# Patient Record
Sex: Female | Born: 1985 | Race: White | Hispanic: No | Marital: Single | Smoking: Never smoker
Health system: Southern US, Community
[De-identification: ages and names within clinical notes are randomized; demographics above are authoritative.]

## PROBLEM LIST (undated history)

## (undated) DIAGNOSIS — B9689 Other specified bacterial agents as the cause of diseases classified elsewhere: Secondary | ICD-10-CM

## (undated) DIAGNOSIS — Z9109 Other allergy status, other than to drugs and biological substances: Secondary | ICD-10-CM

## (undated) DIAGNOSIS — N760 Acute vaginitis: Secondary | ICD-10-CM

## (undated) DIAGNOSIS — B009 Herpesviral infection, unspecified: Secondary | ICD-10-CM

## (undated) DIAGNOSIS — B379 Candidiasis, unspecified: Secondary | ICD-10-CM

## (undated) HISTORY — DX: Herpesviral infection, unspecified: B00.9

## (undated) HISTORY — DX: Other specified bacterial agents as the cause of diseases classified elsewhere: B96.89

## (undated) HISTORY — PX: WISDOM TOOTH EXTRACTION: SHX21

## (undated) HISTORY — DX: Candidiasis, unspecified: B37.9

## (undated) HISTORY — PX: TONSILLECTOMY: SUR1361

## (undated) HISTORY — DX: Other allergy status, other than to drugs and biological substances: Z91.09

## (undated) HISTORY — DX: Other specified bacterial agents as the cause of diseases classified elsewhere: N76.0

---

## 2011-01-14 ENCOUNTER — Emergency Department (HOSPITAL_COMMUNITY)
Admission: EM | Admit: 2011-01-14 | Discharge: 2011-01-14 | Payer: Self-pay | Source: Home / Self Care | Admitting: Emergency Medicine

## 2013-10-29 ENCOUNTER — Ambulatory Visit (INDEPENDENT_AMBULATORY_CARE_PROVIDER_SITE_OTHER): Payer: BC Managed Care – PPO | Admitting: Family Medicine

## 2013-10-29 VITALS — BP 114/66 | HR 93 | Temp 98.6°F | Resp 17 | Ht 66.5 in | Wt 143.6 lb

## 2013-10-29 DIAGNOSIS — J309 Allergic rhinitis, unspecified: Secondary | ICD-10-CM

## 2013-10-29 DIAGNOSIS — R0981 Nasal congestion: Secondary | ICD-10-CM

## 2013-10-29 MED ORDER — PSEUDOEPHEDRINE HCL ER 120 MG PO TB12: 120.0000 mg | ORAL_TABLET | Freq: Two times a day (BID) | ORAL | Status: DC

## 2013-10-29 MED ORDER — IPRATROPIUM BROMIDE 0.03 % NA SOLN: 2.0000 | Freq: Two times a day (BID) | NASAL | Status: DC

## 2013-10-29 MED ORDER — METHYLPREDNISOLONE ACETATE 80 MG/ML IJ SUSP
80.0000 mg | Freq: Once | INTRAMUSCULAR | Status: AC
  Administered 2013-10-29: 80 mg via INTRAMUSCULAR

## 2013-10-29 MED ORDER — HYDROCOD POLST-CHLORPHEN POLST 10-8 MG/5ML PO LQCR: 5.0000 mL | Freq: Two times a day (BID) | ORAL | Status: DC | PRN

## 2013-10-29 MED ORDER — FLUTICASONE PROPIONATE 50 MCG/ACT NA SUSP: 2.0000 | Freq: Every day | NASAL | Status: DC

## 2013-10-29 NOTE — Progress Notes (Addendum)
This chart was scribed for Norberto Sorenson, MD by Ardelia Mems, Scribe. This patient was seen in room Room 13 and the patient's care was started at 10:33 AM.  Subjective:    Patient ID: Barbara Finley, female    DOB: 02-20-1986, 27 y.o.   MRN: [id number]  Chief Complaint  Patient presents with  . 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 She reports associated sinus pressure, postnasal drip and a cough, productive of lime green mucus with this as well. She states that she has been blowing her nose frequently, and has been seeing lime green mucous with this. She states that she had a "scratchy" sore throat at the onset of symptoms 3 days ago which she states subsided with the help of Mucinex. She states that she has been having some difficulty sleeping at night in association with symptoms. She states that she has been eating and drinking normally. She also states that she has been urinating and producing stools normally. She states that she has similar symptoms in the past multiple times, and that she has been having similar symptoms about every 3 months recently. She denies ear pain, headaches, jaw pain, SOB or chest pain.  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 Wonders if there is anything she can do to prevent this.  History reviewed. No pertinent past medical history.  No current outpatient prescriptions on file prior to visit.   No current facility-administered medications on file prior to visit.   No Known Allergies  Review of Systems  Constitutional: Negative for appetite change.  HENT: Positive for congestion, postnasal drip, sinus pressure and sore throat (subsided). Negative for dental problem and ear pain.   Respiratory: Positive for cough. Negative  for shortness of breath.   Cardiovascular: Negative for chest pain.  Gastrointestinal: Negative for diarrhea and constipation.  Genitourinary: Negative for dysuria, urgency and hematuria.  Neurological: Negative for headaches.  Psychiatric/Behavioral: Positive for sleep disturbance.      BP 114/66  Pulse 93  Temp(Src) 98.6 F (37 C) (Oral)  Resp 17  Ht 5' 6.5" (1.689 m)  Wt 143 lb 9.6 oz (65.137 kg)  BMI 22.83 kg/m2  SpO2 98% Objective:   Physical Exam  Nursing note and vitals reviewed. Constitutional: She is oriented to person, place, and time. She appears well-developed and well-nourished. No distress.  HENT:  Head: Normocephalic and atraumatic.  Right Ear: Tympanic membrane is not injected, not erythematous and not retracted. A middle ear effusion (large) is present.  Left Ear: Tympanic membrane is not injected, not erythematous and not retracted. A middle ear effusion (large) is present.  Nose: Mucosal edema (left worse than right) and rhinorrhea (right worse than left) present.  Mouth/Throat: Posterior oropharyngeal erythema present. No oropharyngeal exudate.  Tonsils surgically removed.  Eyes: EOM are normal.  Neck: Neck supple. No tracheal deviation present. No mass and no thyromegaly present.  Cardiovascular: Normal rate, regular rhythm and normal heart sounds.   No murmur heard. Pulmonary/Chest: Effort normal and breath sounds normal. No respiratory distress. She has no wheezes. She has no rales.  Musculoskeletal: Normal range of motion.  Lymphadenopathy:       Head (right side): No submandibular, no tonsillar, no preauricular and no posterior auricular adenopathy present.  Head (left side): No submandibular, no tonsillar, no preauricular and no posterior auricular adenopathy present.    She has no cervical adenopathy.       Right cervical: No superficial cervical and no posterior cervical adenopathy present.      Left cervical: No superficial cervical and no  posterior cervical adenopathy present.       Right: No supraclavicular adenopathy present.       Left: No supraclavicular adenopathy present.  Neurological: She is alert and oriented to person, place, and time.  Skin: Skin is warm and dry.  Psychiatric: She has a normal mood and affect. Her behavior is normal.      Assessment & Plan:   Sinus congestion - Plan: methylPREDNISolone acetate (DEPO-MEDROL) injection 80 mg No sign of bacterial infection today on exam but warned pt that it is poss to get a secondary illness due to immunosuppression so careful hand hygiene and RTC if sxs worsen. Advised patient to start using a netti pot or sinus rinse to treat and prevent sxs. See pt instructions. Meds ordered this encounter  Medications  . fluticasone (FLONASE) 50 MCG/ACT nasal spray    Sig: Place 2 sprays into the nose daily.    Dispense:  16 g    Refill:  6  . ipratropium (ATROVENT) 0.03 % nasal spray    Sig: Place 2 sprays into the nose 2 (two) times daily.    Dispense:  30 mL    Refill:  5  . chlorpheniramine-HYDROcodone (TUSSIONEX PENNKINETIC ER) 10-8 MG/5ML LQCR    Sig: Take 5 mLs by mouth every 12 (twelve) hours as needed (cough).    Dispense:  115 mL    Refill:  0  . methylPREDNISolone acetate (DEPO-MEDROL) injection 80 mg    Sig:   . pseudoephedrine (SUDAFED 12 HOUR) 120 MG 12 hr tablet    Sig: Take 1 tablet (120 mg total) by mouth every 12 (twelve) hours.    Dispense:  30 tablet    Refill:  0    I personally performed the services described in this documentation, which was scribed in my presence. The recorded information has been reviewed and considered, and addended by me as needed.  Norberto Sorenson, MD MPH

## 2013-10-29 NOTE — Patient Instructions (Signed)
Hot showers or breathing in steam may help loosen the congestion.  Using a netti pot or sinus rinse is also likely to help you feel better and keep this from progressing.  Use the atrovent nasal spray as needed throughout the day and use the fluticasone nasal spray every night before bed for at least 2 weeks.  I recommend augmenting with 12 hr sudafed (behind the counter) and generic mucinex to help you move out the congestion.  If no improvement or you are getting worse, come back as you might need a course of steroids but hopefully with all of the above, you can avoid it. Sinusitis Sinusitis is redness, soreness, and swelling (inflammation) of the paranasal sinuses. Paranasal sinuses are air pockets within the bones of your face (beneath the eyes, the middle of the forehead, or above the eyes). In healthy paranasal sinuses, mucus is able to drain out, and air is able to circulate through them by way of your nose. However, when your paranasal sinuses are inflamed, mucus and air can become trapped. This can allow bacteria and other germs to grow and cause infection. Sinusitis can develop quickly and last only a short time (acute) or continue over a long period (chronic). Sinusitis that lasts for more than 12 weeks is considered chronic.  CAUSES  Causes of sinusitis include:  Allergies.  Structural abnormalities, such as displacement of the cartilage that separates your nostrils (deviated septum), which can decrease the air flow through your nose and sinuses and affect sinus drainage.  Functional abnormalities, such as when the small hairs (cilia) that line your sinuses and help remove mucus do not work properly or are not present. SYMPTOMS  Symptoms of acute and chronic sinusitis are the same. The primary symptoms are pain and pressure around the affected sinuses. Other symptoms include:  Upper toothache.  Earache.  Headache.  Bad breath.  Decreased sense of smell and taste.  A cough, which  worsens when you are lying flat.  Fatigue.  Fever.  Thick drainage from your nose, which often is green and may contain pus (purulent).  Swelling and warmth over the affected sinuses. DIAGNOSIS  Your caregiver will perform a physical exam. During the exam, your caregiver may:  Look in your nose for signs of abnormal growths in your nostrils (nasal polyps).  Tap over the affected sinus to check for signs of infection.  View the inside of your sinuses (endoscopy) with a special imaging device with a light attached (endoscope), which is inserted into your sinuses. If your caregiver suspects that you have chronic sinusitis, one or more of the following tests may be recommended:  Allergy tests.  Nasal culture A sample of mucus is taken from your nose and sent to a lab and screened for bacteria.  Nasal cytology A sample of mucus is taken from your nose and examined by your caregiver to determine if your sinusitis is related to an allergy. TREATMENT  Most cases of acute sinusitis are related to a viral infection and will resolve on their own within 10 days. Sometimes medicines are prescribed to help relieve symptoms (pain medicine, decongestants, nasal steroid sprays, or saline sprays).  However, for sinusitis related to a bacterial infection, your caregiver will prescribe antibiotic medicines. These are medicines that will help kill the bacteria causing the infection.  Rarely, sinusitis is caused by a fungal infection. In theses cases, your caregiver will prescribe antifungal medicine. For some cases of chronic sinusitis, surgery is needed. Generally, these are cases in   which sinusitis recurs more than 3 times per year, despite other treatments. HOME CARE INSTRUCTIONS   Drink plenty of water. Water helps thin the mucus so your sinuses can drain more easily.  Use a humidifier.  Inhale steam 3 to 4 times a day (for example, sit in the bathroom with the shower running).  Apply a warm,  moist washcloth to your face 3 to 4 times a day, or as directed by your caregiver.  Use saline nasal sprays to help moisten and clean your sinuses.  Take over-the-counter or prescription medicines for pain, discomfort, or fever only as directed by your caregiver. SEEK IMMEDIATE MEDICAL CARE IF:  You have increasing pain or severe headaches.  You have nausea, vomiting, or drowsiness.  You have swelling around your face.  You have vision problems.  You have a stiff neck.  You have difficulty breathing. MAKE SURE YOU:   Understand these instructions.  Will watch your condition.  Will get help right away if you are not doing well or get worse. Document Released: 12/14/2005 Document Revised: 03/07/2012 Document Reviewed: 12/29/2011 Exit[employer] Patient Information 2014 [employer].  

## 2014-12-15 ENCOUNTER — Other Ambulatory Visit: Payer: Self-pay | Admitting: Family Medicine

## 2014-12-29 ENCOUNTER — Ambulatory Visit (INDEPENDENT_AMBULATORY_CARE_PROVIDER_SITE_OTHER): Payer: BC Managed Care – PPO | Admitting: Family Medicine

## 2014-12-29 VITALS — BP 116/66 | HR 65 | Temp 97.8°F | Resp 18 | Ht 66.0 in | Wt 158.0 lb

## 2014-12-29 DIAGNOSIS — R0981 Nasal congestion: Secondary | ICD-10-CM

## 2014-12-29 DIAGNOSIS — H6982 Other specified disorders of Eustachian tube, left ear: Secondary | ICD-10-CM

## 2014-12-29 MED ORDER — PREDNISONE 20 MG PO TABS: ORAL_TABLET | ORAL | Status: DC

## 2014-12-29 NOTE — Progress Notes (Signed)
[facility] [address], [place] [address] [phone]  Date:  12/29/2014   Name:  Barbara Finley   DOB:  January 17, 1986   MRN:  [id number]  PCP:  No primary care provider on file.    Chief Complaint: Ear Pain   History of Present Illness:  Barbara Finley is a 29 y.o. very pleasant female patient who presents with the following:  She has a history of sinus problems- uses flonase and atrovent nasal.  Over the holidays she ran out of her nasal sprays for a few days and this seemed to trigger sinus pressure and discomfort. She now notes pain in her right ear- and starting to bother her left ear as well.  She did have a ST for a day or so, and took some mucinex that did help.   She is not coughing. She is blowing material out of her nose but this is typical for her She has not noted a fever  She has an IUD and does not get menses   There are no active problems to display for this patient.   History reviewed. No pertinent past medical history.  History reviewed. No pertinent past surgical history.  History  Substance Use Topics  . Smoking status: Never Smoker   . Smokeless tobacco: Not on file  . Alcohol Use: No    History reviewed. No pertinent family history.  No Known Allergies  Medication list has been reviewed and updated.  Current Outpatient Prescriptions on File Prior to Visit  Medication Sig Dispense Refill  . fluticasone (FLONASE) 50 MCG/ACT nasal spray Place 2 sprays into the nose daily. 16 g 6  . ipratropium (ATROVENT) 0.03 % nasal spray Place 2 sprays into both nostrils 2 (two) times daily. PATIENT NEEDS OFFICE VISIT FOR ADDITIONAL REFILLS 30 mL 0  . chlorpheniramine-HYDROcodone (TUSSIONEX PENNKINETIC ER) 10-8 MG/5ML LQCR Take 5 mLs by mouth every 12 (twelve) hours as needed (cough). (Patient not taking: Reported on 12/29/2014) 115 mL 0  . pseudoephedrine (SUDAFED 12 HOUR) 120 MG 12 hr tablet Take 1 tablet (120 mg total) by mouth every 12  (twelve) hours. (Patient not taking: Reported on 12/29/2014) 30 tablet 0   No current facility-administered medications on file prior to visit.    Review of Systems:  As per HPI- otherwise negative.   Physical Examination: Filed Vitals:   12/29/14 1225  BP: 116/66  Pulse: 65  Temp: 97.8 F (36.6 C)  Resp: 18   Filed Vitals:   12/29/14 1225  Height:  (1.676 m)  Weight: 158 lb (71.668 kg)   Body mass index is 25.51 kg/(m^2). Ideal Body Weight: Weight in (lb) to have BMI = 25: 154.6  GEN: WDWN, NAD, Non-toxic, A & O x 3, looks well HEENT: Atraumatic, Normocephalic. Neck supple. No masses, No LAD.  Bilateral TM wnl, oropharynx normal.  PEERL,EOMI.   Ears and Nose: No external deformity. CV: RRR, No M/G/R. No JVD. No thrill. No extra heart sounds. PULM: CTA B, no wheezes, crackles, rhonchi. No retractions. No resp. distress. No accessory muscle use. ABD: S, NT, ND, +BS. No rebound. No HSM. EXTR: No c/c/e NEURO Normal gait.  PSYCH: Normally interactive. Conversant. Not depressed or anxious appearing.  Calm demeanor.  Nasal cavity is quite inflamed and congested   Assessment and Plan: Sinus congestion - Plan: predniSONE (DELTASONE) 20 MG tablet  ETD (eustachian tube dysfunction), left - Plan: predniSONE (DELTASONE) 20 MG tablet  Nasal congestion and ETD causing  earache.  Will treat with a short course of prednisone, and she will let me know if not feeling better soon Will start with 3 days of prednisone 40, may continue with 3 days more of  per day.  She will let me know if not better  Signed Abbe Amsterdam, MD

## 2015-02-14 LAB — HM PAP SMEAR: HM PAP: NEGATIVE

## 2015-08-13 ENCOUNTER — Encounter: Payer: Self-pay | Admitting: *Deleted

## 2015-08-15 ENCOUNTER — Ambulatory Visit: Payer: Self-pay | Admitting: Obstetrics and Gynecology

## 2015-09-12 ENCOUNTER — Ambulatory Visit (INDEPENDENT_AMBULATORY_CARE_PROVIDER_SITE_OTHER): Payer: BC Managed Care – PPO | Admitting: Obstetrics and Gynecology

## 2015-09-12 ENCOUNTER — Encounter: Payer: Self-pay | Admitting: Obstetrics and Gynecology

## 2015-09-12 VITALS — BP 124/84 | HR 88 | Ht 67.0 in | Wt 140.7 lb

## 2015-09-12 DIAGNOSIS — B977 Papillomavirus as the cause of diseases classified elsewhere: Secondary | ICD-10-CM | POA: Diagnosis not present

## 2015-09-12 DIAGNOSIS — R8789 Other abnormal findings in specimens from female genital organs: Secondary | ICD-10-CM

## 2015-09-12 DIAGNOSIS — IMO0002 Reserved for concepts with insufficient information to code with codable children: Secondary | ICD-10-CM

## 2015-09-12 NOTE — Progress Notes (Signed)
Subjective:     Patient ID: Barbara Finley, female   DOB: [birth year], 29 y.o.   MRN: [id number]  HPI Here for repeat pap  Review of Systems Occasional fishy odor, not now    Objective:   Physical Exam     Assessment:     A&O x4 Well groomed female in no distress Pelvic exam: normal external genitalia, vulva, vagina, cervix, uterus and adnexa, IUD string noted.     Plan:     H/o abnormal pap and recurrent BV Pap obtained RTC 6 months for AE  Yolanda Bonine, CNM

## 2015-09-13 ENCOUNTER — Other Ambulatory Visit: Payer: Self-pay | Admitting: Obstetrics and Gynecology

## 2015-09-16 LAB — CYTOLOGY - PAP

## 2015-09-17 ENCOUNTER — Telehealth: Payer: Self-pay | Admitting: *Deleted

## 2015-09-17 NOTE — Telephone Encounter (Signed)
-----   Message from Ulyses Amor, [place] sent at 09/17/2015  1:45 PM EDT ----- Please let her know her pap and STI screen were negative

## 2015-09-17 NOTE — Telephone Encounter (Signed)
Notified pt of results, she voiced understanding.

## 2015-09-23 ENCOUNTER — Ambulatory Visit (INDEPENDENT_AMBULATORY_CARE_PROVIDER_SITE_OTHER): Payer: BC Managed Care – PPO | Admitting: Physician Assistant

## 2015-09-23 VITALS — BP 104/74 | HR 90 | Temp 98.9°F | Resp 16 | Ht 65.0 in | Wt 142.2 lb

## 2015-09-23 DIAGNOSIS — J069 Acute upper respiratory infection, unspecified: Secondary | ICD-10-CM

## 2015-09-23 DIAGNOSIS — B9789 Other viral agents as the cause of diseases classified elsewhere: Secondary | ICD-10-CM

## 2015-09-23 DIAGNOSIS — Z91048 Other nonmedicinal substance allergy status: Secondary | ICD-10-CM | POA: Diagnosis not present

## 2015-09-23 DIAGNOSIS — Z9109 Other allergy status, other than to drugs and biological substances: Secondary | ICD-10-CM

## 2015-09-23 MED ORDER — FLUTICASONE PROPIONATE 50 MCG/ACT NA SUSP: 2.0000 | Freq: Every day | NASAL | Status: DC

## 2015-09-23 MED ORDER — HYDROCODONE-HOMATROPINE 5-1.5 MG/5ML PO SYRP: 5.0000 mL | ORAL_SOLUTION | Freq: Every day | ORAL | Status: DC

## 2015-09-23 MED ORDER — NAPROXEN 500 MG PO TABS: 500.0000 mg | ORAL_TABLET | Freq: Two times a day (BID) | ORAL | Status: DC

## 2015-09-23 MED ORDER — LORATADINE-PSEUDOEPHEDRINE ER 10-240 MG PO TB24: 1.0000 | ORAL_TABLET | Freq: Every day | ORAL | Status: DC

## 2015-09-23 NOTE — Progress Notes (Signed)
09/23/2015 at 2:24 PM  Barbara Finley / DOB: 1986/03/09 / MRN: [id number]  The patient  does not have a problem list on file.  SUBJECTIVE  Barbara Finley is a 29 y.o. female complaining of nasal blockage, post nasal drip and sinus and nasal congestion that started 7 days ago.  Associated symptoms include cough today, and she denies fever, difficulty breathing, headache and jaw pain.The patient symptoms show no change. Treatments tried thus far include Mucinex with poor relief. She reports sick contacts.    She  has a past medical history of Yeast infection; Bacterial vaginosis; and Environmental allergies.    Medications reviewed and updated by myself where necessary, and exist elsewhere in the encounter.   Barbara Finley has No Known Allergies. She  reports that she has never smoked. She has never used smokeless tobacco. She reports that she does not drink alcohol or use illicit drugs. She  reports that she currently engages in sexual activity. She reports using the following method of birth control/protection: IUD. The patient  has past surgical history that includes Tonsillectomy.  Her family history is not on file.  Review of Systems  Constitutional: Negative for fever and chills.  Respiratory: Negative for shortness of breath.   Cardiovascular: Negative for chest pain.  Gastrointestinal: Negative for nausea and abdominal pain.  Genitourinary: Negative.   Skin: Negative for rash.  Neurological: Negative for dizziness and headaches.    OBJECTIVE  Her  height is  (1.651 m) and weight is 142 lb 3.2 oz (64.501 kg). Her oral temperature is 98.9 F (37.2 C). Her blood pressure is 104/74 and her pulse is 90. Her respiration is 16 and oxygen saturation is 99%.  The patient's body mass index is 23.66 kg/(m^2).  Physical Exam  Constitutional: She is oriented to person, place, and time. She appears well-developed and well-nourished. No distress.  HENT:  Right Ear: Hearing, tympanic  membrane, external ear and ear canal normal.  Left Ear: Hearing, tympanic membrane, external ear and ear canal normal.  Nose: Mucosal edema present. Right sinus exhibits no maxillary sinus tenderness and no frontal sinus tenderness. Left sinus exhibits no maxillary sinus tenderness and no frontal sinus tenderness.  Mouth/Throat: Uvula is midline, oropharynx is clear and moist and mucous membranes are normal.  Cardiovascular: Normal rate, regular rhythm and normal heart sounds.   Respiratory: Effort normal and breath sounds normal. She has no wheezes. She has no rales.  Neurological: She is alert and oriented to person, place, and time.  Skin: Skin is warm and dry. She is not diaphoretic.  Psychiatric: She has a normal mood and affect.    No results found for this or any previous visit (from the past 24 hour(s)).  ASSESSMENT & PLAN  Barbara Finley was seen today for sinusitis, cough, sore throat and medication refill.  Diagnoses and all orders for this visit:  History of environmental allergies -     fluticasone (FLONASE) 50 MCG/ACT nasal spray; Place 2 sprays into both nostrils daily.  Viral URI with cough -     loratadine-pseudoephedrine (CLARITIN-D 24-HOUR) 10-240 MG per 24 hr tablet; Take 1 tablet by mouth daily. -     naproxen (NAPROSYN) 500 MG tablet; Take 1 tablet (500 mg total) by mouth 2 (two) times daily with a meal. -     HYDROcodone-homatropine (HYCODAN) 5-1.5 MG/5ML syrup; Take 5 mLs by mouth at bedtime.   The patient was advised to call or come back to clinic if she does not  see an improvement in symptoms, or worsens with the above plan.   Deliah Boston, MHS, PA-C [facility] 09/23/2015 2:24 PM

## 2016-02-20 ENCOUNTER — Encounter: Payer: Self-pay | Admitting: Obstetrics and Gynecology

## 2016-03-06 ENCOUNTER — Encounter: Payer: BC Managed Care – PPO | Admitting: Obstetrics and Gynecology

## 2016-04-01 ENCOUNTER — Other Ambulatory Visit: Payer: Self-pay

## 2016-04-01 DIAGNOSIS — Z9109 Other allergy status, other than to drugs and biological substances: Secondary | ICD-10-CM

## 2016-04-01 MED ORDER — FLUTICASONE PROPIONATE 50 MCG/ACT NA SUSP: 2.0000 | Freq: Every day | NASAL | Status: DC

## 2016-04-03 ENCOUNTER — Encounter: Payer: BC Managed Care – PPO | Admitting: Obstetrics and Gynecology

## 2016-08-13 ENCOUNTER — Ambulatory Visit: Payer: BC Managed Care – PPO | Admitting: Obstetrics and Gynecology

## 2016-09-17 ENCOUNTER — Encounter: Payer: Self-pay | Admitting: Obstetrics and Gynecology

## 2016-09-17 ENCOUNTER — Other Ambulatory Visit: Payer: Self-pay | Admitting: Obstetrics and Gynecology

## 2016-09-17 ENCOUNTER — Ambulatory Visit (INDEPENDENT_AMBULATORY_CARE_PROVIDER_SITE_OTHER): Payer: BC Managed Care – PPO | Admitting: Obstetrics and Gynecology

## 2016-09-17 VITALS — BP 130/84 | HR 66 | Ht 67.0 in | Wt 147.6 lb

## 2016-09-17 DIAGNOSIS — Z113 Encounter for screening for infections with a predominantly sexual mode of transmission: Secondary | ICD-10-CM

## 2016-09-17 DIAGNOSIS — J302 Other seasonal allergic rhinitis: Secondary | ICD-10-CM

## 2016-09-17 DIAGNOSIS — N760 Acute vaginitis: Secondary | ICD-10-CM | POA: Diagnosis not present

## 2016-09-17 DIAGNOSIS — A499 Bacterial infection, unspecified: Secondary | ICD-10-CM

## 2016-09-17 DIAGNOSIS — B9689 Other specified bacterial agents as the cause of diseases classified elsewhere: Secondary | ICD-10-CM

## 2016-09-17 MED ORDER — METRONIDAZOLE 500 MG PO TABS: 500.0000 mg | ORAL_TABLET | Freq: Two times a day (BID) | ORAL | 0 refills | Status: AC

## 2016-09-17 MED ORDER — LORATADINE 10 MG PO TABS: 10.0000 mg | ORAL_TABLET | Freq: Every day | ORAL | 4 refills | Status: DC

## 2016-09-17 MED ORDER — MONTELUKAST SODIUM 10 MG PO TABS: 10.0000 mg | ORAL_TABLET | Freq: Every day | ORAL | 4 refills | Status: DC

## 2016-09-17 NOTE — Patient Instructions (Signed)
Thank you for enrolling in MyChart. Please follow the instructions below to securely access your online medical record. MyChart allows you to send messages to your doctor, view your test results, renew your prescriptions, schedule appointments, and more.  How Do I Sign Up? 1. In your Internet browser, go to [email or web address]. 2. Click on the New  User? link in the Sign In box.  3. Enter your MyChart Access Code exactly as it appears below. You will not need to use this code after you have completed the sign-up process. If you do not sign up before the expiration date, you must request a new code. MyChart Access Code: [email or web address] Expires: 11/16/2016  3:56 PM  4. Enter the last four digits of your Social Security Number (xxxx) and Date of Birth (mm/dd/yyyy) as indicated and click Next. You will be taken to the next sign-up page. 5. Create a MyChart ID. This will be your MyChart login ID and cannot be changed, so think of one that is secure and easy to remember. 6. Create a MyChart password. You can change your password at any time. 7. Enter your Password Reset Question and Answer and click Next. This can be used at a later time if you forget your password.  8. Select your communication preference, and if applicable enter your e-mail address. You will receive e-mail notification when new information is available in MyChart by choosing to receive e-mail notifications and filling in your e-mail. 9. Click Sign In. You can now view your medical record.   Additional Information If you have questions, you can email [email or web address] or call [phone] to talk to our MyChart staff. Remember, MyChart is NOT to be used for urgent needs. For medical emergencies, dial 911.

## 2016-09-17 NOTE — Progress Notes (Signed)
``  Subjective:     Patient ID: Barbara CohnNichole Finley, female   DOB: October 07, 1986, 30 y.o.   MRN: [id number]  HPI Vaginal odor for last few months. Desires STD screening. Reports irregular menses with Mirena use. Denies postcoital spotting. Does states she and boyfriend split up in January and both had other sexual partners. She used condoms but not sure he did.  Review of Systems Rhinitis worse for last few weeks with no change in medications. All other major systems are negative except stated in HPI    Objective:   Physical Exam A&O x4 Well groomed female in no distress Blood pressure 130/84, pulse 66, height 5\' 7"  (1.702 m), weight 147 lb 9.6 oz (67 kg). Pelvic exam: normal external genitalia, vulva, vagina, cervix, uterus and adnexa, CERVIX: normal appearing cervix without discharge or lesions, DNA probe for chlamydia and GC obtained, IUD string noted with scant white d/c at cervix, WET MOUNT done - results: KOH done, clue cells, excessive bacteria, white blood cells.    Assessment:     STD screen BV Allergy medication management    Plan:     NuSwab set for [place] screen Flagyl prescribed for BV and instructed on use and expected outcome Added Singulair daily to allery regimen Will notify of lab results via MyChart RTC as needed.

## 2016-09-24 ENCOUNTER — Encounter: Payer: Self-pay | Admitting: Obstetrics and Gynecology

## 2016-09-25 ENCOUNTER — Other Ambulatory Visit: Payer: Self-pay | Admitting: *Deleted

## 2016-09-25 MED ORDER — CLINDAMYCIN PHOSPHATE (1 DOSE) 2 % VA CREA: 1.0000 | TOPICAL_CREAM | Freq: Two times a day (BID) | VAGINAL | 0 refills | Status: DC

## 2016-11-19 ENCOUNTER — Other Ambulatory Visit: Payer: Self-pay | Admitting: Physician Assistant

## 2016-11-19 DIAGNOSIS — Z9109 Other allergy status, other than to drugs and biological substances: Secondary | ICD-10-CM

## 2017-01-29 ENCOUNTER — Other Ambulatory Visit: Payer: Self-pay | Admitting: *Deleted

## 2017-01-29 ENCOUNTER — Encounter: Payer: Self-pay | Admitting: Obstetrics and Gynecology

## 2017-01-29 MED ORDER — METRONIDAZOLE 500 MG PO TABS: 500.0000 mg | ORAL_TABLET | Freq: Two times a day (BID) | ORAL | 3 refills | Status: DC

## 2017-02-25 ENCOUNTER — Encounter: Payer: Self-pay | Admitting: Obstetrics and Gynecology

## 2017-02-25 ENCOUNTER — Other Ambulatory Visit: Payer: Self-pay | Admitting: *Deleted

## 2017-02-25 DIAGNOSIS — Z9109 Other allergy status, other than to drugs and biological substances: Secondary | ICD-10-CM

## 2017-02-25 MED ORDER — FLUTICASONE PROPIONATE 50 MCG/ACT NA SUSP: 2.0000 | Freq: Every day | NASAL | 4 refills | Status: DC

## 2017-02-25 MED ORDER — METRONIDAZOLE 500 MG PO TABS: 500.0000 mg | ORAL_TABLET | Freq: Two times a day (BID) | ORAL | 3 refills | Status: DC

## 2017-03-18 ENCOUNTER — Encounter: Payer: Self-pay | Admitting: Obstetrics and Gynecology

## 2017-03-18 ENCOUNTER — Ambulatory Visit (INDEPENDENT_AMBULATORY_CARE_PROVIDER_SITE_OTHER): Payer: BC Managed Care – PPO | Admitting: Obstetrics and Gynecology

## 2017-03-18 VITALS — BP 127/77 | HR 62 | Ht 67.0 in | Wt 159.6 lb

## 2017-03-18 DIAGNOSIS — A630 Anogenital (venereal) warts: Secondary | ICD-10-CM

## 2017-03-18 NOTE — Progress Notes (Signed)
Subjective:     Patient ID: Barbara Finley, female   DOB: May 05, 1986, 31 y.o.   MRN: [id number]  HPI Reports intermittent itching at vaginal opening, and occasional odor after sex. States has been happening for last few months. Denies bleeding or changes in vaginal discharge. No change in sexual partner. IUD in places and no menses since it was placed.                  Review of Systems Negative except stated above in HPI    Objective:   Physical Exam A&O x4 Well groomed female in no distress Blood pressure 127/77, pulse 62, height 5\' 7"  (1.702 m), weight 159 lb 9.6 oz (72.4 kg). Pelvic exam: VULVA: vulvar lesion c/w condyloma noted at introitus bilaterally- TCA 80% applied without difficulty, VAGINA: normal appearing vagina with normal color and discharge, no lesions, CERVIX: normal appearing cervix without discharge or lesions, IUD string noted.    Assessment:     Condyloma of vulva IUD check    Plan:     Feel that today's TCA treatment should clear current HPV as area was in early stages and small. RTC as needed.  Barbara Finley, CNM

## 2017-04-27 ENCOUNTER — Other Ambulatory Visit: Payer: Self-pay | Admitting: Physician Assistant

## 2017-04-27 ENCOUNTER — Other Ambulatory Visit: Payer: Self-pay

## 2017-04-27 ENCOUNTER — Encounter: Payer: Self-pay | Admitting: Obstetrics and Gynecology

## 2017-04-27 DIAGNOSIS — N760 Acute vaginitis: Secondary | ICD-10-CM

## 2017-04-27 DIAGNOSIS — B9789 Other viral agents as the cause of diseases classified elsewhere: Principal | ICD-10-CM

## 2017-04-27 DIAGNOSIS — R350 Frequency of micturition: Secondary | ICD-10-CM

## 2017-04-27 DIAGNOSIS — R399 Unspecified symptoms and signs involving the genitourinary system: Secondary | ICD-10-CM

## 2017-04-27 DIAGNOSIS — B9689 Other specified bacterial agents as the cause of diseases classified elsewhere: Secondary | ICD-10-CM

## 2017-04-27 DIAGNOSIS — J069 Acute upper respiratory infection, unspecified: Secondary | ICD-10-CM

## 2017-04-27 MED ORDER — METRONIDAZOLE 500 MG PO TABS: 500.0000 mg | ORAL_TABLET | Freq: Two times a day (BID) | ORAL | 3 refills | Status: DC

## 2017-04-28 ENCOUNTER — Ambulatory Visit (HOSPITAL_COMMUNITY)
Admission: EM | Admit: 2017-04-28 | Discharge: 2017-04-28 | Disposition: A | Discharge status: Home / Self Care | Payer: BC Managed Care – PPO | Attending: Emergency Medicine | Admitting: Emergency Medicine

## 2017-04-28 ENCOUNTER — Encounter (HOSPITAL_COMMUNITY): Payer: Self-pay | Admitting: Emergency Medicine

## 2017-04-28 DIAGNOSIS — N3001 Acute cystitis with hematuria: Secondary | ICD-10-CM | POA: Diagnosis not present

## 2017-04-28 DIAGNOSIS — R300 Dysuria: Secondary | ICD-10-CM | POA: Diagnosis present

## 2017-04-28 LAB — POCT URINALYSIS DIP (DEVICE)
BILIRUBIN URINE: NEGATIVE
GLUCOSE, UA: NEGATIVE mg/dL
KETONES UR: NEGATIVE mg/dL
Nitrite: NEGATIVE
PH: 6.5 (ref 5.0–8.0)
Protein, ur: 100 mg/dL — AB
SPECIFIC GRAVITY, URINE: 1.02 (ref 1.005–1.030)
Urobilinogen, UA: 0.2 mg/dL (ref 0.0–1.0)

## 2017-04-28 LAB — POCT PREGNANCY, URINE: PREG TEST UR: NEGATIVE

## 2017-04-28 MED ORDER — CEPHALEXIN 500 MG PO CAPS: 500.0000 mg | ORAL_CAPSULE | Freq: Four times a day (QID) | ORAL | 0 refills | Status: DC

## 2017-04-28 MED ORDER — LORATADINE-PSEUDOEPHEDRINE ER 10-240 MG PO TB24: 1.0000 | ORAL_TABLET | Freq: Every day | ORAL | 0 refills | Status: DC

## 2017-04-28 NOTE — ED Triage Notes (Signed)
History of uti.  Woke with pain during the night 2 nights ago.  Pain at the end of stream.  Frequency, urgency

## 2017-04-28 NOTE — Discharge Instructions (Signed)
Take AZO standard 2 tablets 3 times a day as needed for urinary tract symptoms. Drink plenty of water. Start taking the antibiotics today.

## 2017-04-28 NOTE — ED Provider Notes (Signed)
CSN: [id number]     Arrival date & time 04/28/17  1750 History   First MD Initiated Contact with Patient 04/28/17 1823     Chief Complaint  Patient presents with  . 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 After micturition she has some pain and an every few minutes stronger urge to have to urinate. Denies fever or chills. No vomiting. Does have some right flank pain.      Past Medical History:  Diagnosis Date  . Bacterial vaginosis   . Environmental allergies   . Yeast infection    Past Surgical History:  Procedure Laterality Date  . TONSILLECTOMY     No family history on file. Social History  Substance Use Topics  . Smoking status: Never Smoker  . Smokeless tobacco: Never Used  . Alcohol use No   OB History    No data available     Review of Systems  Constitutional: Negative.   Respiratory: Negative.   Genitourinary: Positive for dysuria and frequency. Negative for hematuria, pelvic pain, vaginal discharge and vaginal pain.  Neurological: Negative.   All other systems reviewed and are negative.   Allergies  Patient has no known allergies.  Home Medications   Prior to Admission medications   Medication Sig Start Date End Date Taking? Authorizing Provider  cephALEXin (KEFLEX) 500 MG capsule Take 1 capsule (500 mg total) by mouth 4 (four) times daily. 04/28/17   Hayden Rasmussen, NP  fluticasone (FLONASE) 50 MCG/ACT nasal spray Place 2 sprays into both nostrils daily. 02/25/17   Melody Suzan Nailer, CNM  levonorgestrel (MIRENA) 20 MCG/24HR IUD 1 each by Intrauterine route once.    Historical Provider, MD  montelukast (SINGULAIR) 10 MG tablet Take 1 tablet (10 mg total) by mouth at bedtime. Patient not taking: Reported on 03/18/2017 09/17/16   Melody N Shambley, CNM  naproxen (NAPROSYN) 500 MG tablet  Take 1 tablet (500 mg total) by mouth 2 (two) times daily with a meal. Patient not taking: Reported on 09/17/2016 09/23/15   Ofilia Neas, PA-C   Meds Ordered and Administered this Visit  Medications - No data to display  BP 119/80 (BP Location: Left Arm)   Pulse 79   Temp 98.4 F (36.9 C) (Oral)   Resp 18   SpO2 99%  No data found.   Physical Exam  Constitutional: She is oriented to person, place, and time. She appears well-developed and well-nourished. No distress.  Eyes: EOM are normal.  Neck: Neck supple.  Cardiovascular: Normal rate.   Pulmonary/Chest: Effort normal. No respiratory distress.  Musculoskeletal: She exhibits no edema.  Neurological: She is alert and oriented to person, place, and time. She exhibits normal muscle tone.  Skin: Skin is warm and dry.  Psychiatric: She has a normal mood and affect.  Nursing note and vitals reviewed.   Urgent Care Course     Procedures (including critical care time)  Labs Review Labs Reviewed  POCT URINALYSIS DIP (DEVICE) - Abnormal; Notable for the following:       Result Value   Hgb urine dipstick LARGE (*)    Protein, ur 100 (*)    Leukocytes, UA LARGE (*)    All other components within normal limits  URINE CULTURE  POCT PREGNANCY, URINE    Imaging Review No results found.   Visual Acuity Review  Right Eye Distance:   Left Eye Distance:  Bilateral Distance:    Right Eye Near:   Left Eye Near:    Bilateral Near:         MDM   1. Acute cystitis with hematuria    Take AZO standard 2 tablets 3 times a day as needed for urinary tract symptoms. Drink plenty of water. Start taking the antibiotics today. Meds ordered this encounter  Medications  . cephALEXin (KEFLEX) 500 MG capsule    Sig: Take 1 capsule (500 mg total) by mouth 4 (four) times daily.    Dispense:  28 capsule    Refill:  0    Order Specific Question:   Supervising Provider    Answer:   Domenick Gong [[id number]]       Hayden Rasmussen,  NP 04/28/17 [phone]

## 2017-04-29 NOTE — Telephone Encounter (Signed)
Called to [facility]

## 2017-05-01 LAB — URINE CULTURE: Special Requests: NORMAL

## 2017-06-16 ENCOUNTER — Other Ambulatory Visit: Payer: Self-pay | Admitting: *Deleted

## 2017-06-16 ENCOUNTER — Encounter: Payer: Self-pay | Admitting: Obstetrics and Gynecology

## 2017-06-16 MED ORDER — METRONIDAZOLE 500 MG PO TABS: 500.0000 mg | ORAL_TABLET | Freq: Two times a day (BID) | ORAL | 2 refills | Status: DC

## 2017-07-05 ENCOUNTER — Other Ambulatory Visit: Payer: Self-pay | Admitting: *Deleted

## 2017-07-05 ENCOUNTER — Encounter: Payer: Self-pay | Admitting: Obstetrics and Gynecology

## 2017-07-05 MED ORDER — METRONIDAZOLE 0.75 % VA GEL: 1.0000 | Freq: Two times a day (BID) | VAGINAL | 2 refills | Status: DC

## 2017-08-25 ENCOUNTER — Encounter: Payer: Self-pay | Admitting: Obstetrics and Gynecology

## 2017-08-25 ENCOUNTER — Other Ambulatory Visit: Payer: Self-pay | Admitting: Obstetrics and Gynecology

## 2017-08-25 ENCOUNTER — Ambulatory Visit (INDEPENDENT_AMBULATORY_CARE_PROVIDER_SITE_OTHER): Payer: 59 | Admitting: Obstetrics and Gynecology

## 2017-08-25 VITALS — BP 124/84 | HR 51 | Ht 66.0 in | Wt 160.7 lb

## 2017-08-25 DIAGNOSIS — B9689 Other specified bacterial agents as the cause of diseases classified elsewhere: Secondary | ICD-10-CM | POA: Diagnosis not present

## 2017-08-25 DIAGNOSIS — Z113 Encounter for screening for infections with a predominantly sexual mode of transmission: Secondary | ICD-10-CM | POA: Diagnosis not present

## 2017-08-25 DIAGNOSIS — N760 Acute vaginitis: Secondary | ICD-10-CM

## 2017-08-25 MED ORDER — CLINDAMYCIN PHOSPHATE (1 DOSE) 2 % VA CREA: 1.0000 | TOPICAL_CREAM | Freq: Once | VAGINAL | 2 refills | Status: AC

## 2017-08-25 NOTE — Progress Notes (Signed)
Subjective:     Patient ID: Oletta CohnNichole Drawdy, female   DOB: 05/08/1986, 31 y.o.   MRN: [id number]  HPI Desires STD testing and check for BV, has history of re-occurring BV. Normal menses, no pain with sex, occasional spotting after sex.  Review of Systems Negative except stated above in HPI.    Objective:   Physical Exam A&Ox4 Well groomed female in no distress Blood pressure 124/84, pulse (!) 51, height 5\' 6"  (1.676 m), weight 160 lb 11.2 oz (72.9 kg). Pelvic exam: normal external genitalia, vulva, vagina, cervix, uterus and adnexa, WET MOUNT done - results: clue cells.    Assessment:     BV STD screening    Plan:     Clindesse sample and prescription given RTC 1 month for AE or as needed.  Melody PescaderoShambley, CNM

## 2017-09-05 ENCOUNTER — Other Ambulatory Visit: Payer: Self-pay | Admitting: Obstetrics and Gynecology

## 2017-09-05 MED ORDER — CLINDAMYCIN PHOSPHATE 2 % VA CREA: 1.0000 | TOPICAL_CREAM | Freq: Every day | VAGINAL | 1 refills | Status: DC

## 2017-09-29 ENCOUNTER — Ambulatory Visit (INDEPENDENT_AMBULATORY_CARE_PROVIDER_SITE_OTHER): Payer: 59 | Admitting: Obstetrics and Gynecology

## 2017-09-29 ENCOUNTER — Encounter: Payer: Self-pay | Admitting: Obstetrics and Gynecology

## 2017-09-29 VITALS — BP 132/84 | HR 67 | Ht 67.0 in | Wt 158.4 lb

## 2017-09-29 DIAGNOSIS — Z01419 Encounter for gynecological examination (general) (routine) without abnormal findings: Secondary | ICD-10-CM | POA: Diagnosis not present

## 2017-09-29 NOTE — Progress Notes (Signed)
   Subjective:     Barbara Finley is a single white  31 y.o. female and is here for a comprehensive physical exam. Works FT. Is sexually active.The patient reports no problems.  Social History   Social History  . Marital status: Single    Spouse name: N/A  . Number of children: N/A  . Years of education: N/A   Occupational History  . Not on file.   Social History Main Topics  . Smoking status: Never Smoker  . Smokeless tobacco: Never Used  . Alcohol use No  . Drug use: No  . Sexual activity: Yes    Birth control/ protection: IUD     Comment: mirena   Other Topics Concern  . Not on file   Social History Narrative  . No narrative on file   Health Maintenance  Topic Date Due  . HIV Screening  07/16/2003  . TETANUS/TDAP  07/16/2007  . INFLUENZA VACCINE  07/28/2017  . PAP SMEAR  09/12/2018    The following portions of the patient's history were reviewed and updated as appropriate: allergies, current medications, past family history, past medical history, past social history, past surgical history and problem list.  Review of Systems A comprehensive review of systems was negative.   Objective:    General appearance: alert, cooperative and appears stated age Neck: no adenopathy, no carotid bruit, no JVD, supple, symmetrical, trachea midline and thyroid not enlarged, symmetric, no tenderness/mass/nodules Lungs: clear to auscultation bilaterally Breasts: normal appearance, no masses or tenderness Heart: regular rate and rhythm, S1, S2 normal, no murmur, click, rub or gallop Abdomen: soft, non-tender; bowel sounds normal; no masses,  no organomegaly Pelvic: cervix normal in appearance, external genitalia normal, no adnexal masses or tenderness, no cervical motion tenderness, rectovaginal septum normal, uterus normal size, shape, and consistency, vagina normal without discharge and IUD string noted    Assessment:    Healthy female exam. IUD check STD screen     Plan:   Pap obtained, will follow up accordingly RTC 1 year or as needed.  Parag Dorton Aura Camps, CNM   See After Visit Summary for Counseling Recommendations

## 2017-09-29 NOTE — Patient Instructions (Signed)
Place annual gynecologic exam patient instructions here.

## 2017-09-30 ENCOUNTER — Other Ambulatory Visit: Payer: Self-pay | Admitting: Obstetrics and Gynecology

## 2017-10-01 LAB — CYTOLOGY - PAP

## 2017-10-26 ENCOUNTER — Encounter: Payer: Self-pay | Admitting: Obstetrics and Gynecology

## 2017-11-15 ENCOUNTER — Encounter: Payer: Self-pay | Admitting: Obstetrics and Gynecology

## 2017-12-09 ENCOUNTER — Encounter: Payer: Self-pay | Admitting: Obstetrics and Gynecology

## 2017-12-09 ENCOUNTER — Other Ambulatory Visit: Payer: Self-pay | Admitting: *Deleted

## 2017-12-09 MED ORDER — METRONIDAZOLE 500 MG PO TABS: 500.0000 mg | ORAL_TABLET | Freq: Two times a day (BID) | ORAL | 2 refills | Status: DC

## 2018-04-01 ENCOUNTER — Other Ambulatory Visit: Payer: Self-pay

## 2018-04-01 ENCOUNTER — Ambulatory Visit (HOSPITAL_COMMUNITY)
Admission: RE | Admit: 2018-04-01 | Discharge: 2018-04-01 | Disposition: A | Discharge status: Home / Self Care | Payer: 59 | Source: Ambulatory Visit | Attending: Family Medicine | Admitting: Family Medicine

## 2018-04-01 ENCOUNTER — Ambulatory Visit: Payer: 59 | Admitting: Family Medicine

## 2018-04-01 VITALS — BP 120/80 | HR 95 | Temp 98.6°F | Ht 66.0 in | Wt 150.0 lb

## 2018-04-01 DIAGNOSIS — K6289 Other specified diseases of anus and rectum: Secondary | ICD-10-CM | POA: Diagnosis not present

## 2018-04-01 DIAGNOSIS — Z975 Presence of (intrauterine) contraceptive device: Secondary | ICD-10-CM | POA: Insufficient documentation

## 2018-04-01 DIAGNOSIS — N83201 Unspecified ovarian cyst, right side: Secondary | ICD-10-CM | POA: Diagnosis not present

## 2018-04-01 DIAGNOSIS — N898 Other specified noninflammatory disorders of vagina: Secondary | ICD-10-CM

## 2018-04-01 DIAGNOSIS — R1031 Right lower quadrant pain: Secondary | ICD-10-CM | POA: Insufficient documentation

## 2018-04-01 LAB — POCT CBC
Granulocyte percent: 73.9 %G (ref 37–80)
HCT, POC: 41.1 % (ref 37.7–47.9)
Hemoglobin: 14.5 g/dL (ref 12.2–16.2)
Lymph, poc: 2.3 (ref 0.6–3.4)
MCH, POC: 31.1 pg (ref 27–31.2)
MCHC: 34.5 g/dL (ref 31.8–35.4)
MCV: 90.3 fL (ref 80–97)
MID (cbc): 0.4 (ref 0–0.9)
MPV: 7.6 fL (ref 0–99.8)
POC Granulocyte: 7.6 — AB (ref 2–6.9)
POC LYMPH PERCENT: 22.5 %L (ref 10–50)
POC MID %: 3.6 %M (ref 0–12)
Platelet Count, POC: 272 10*3/uL (ref 142–424)
RBC: 4.66 M/uL (ref 4.04–5.48)
RDW, POC: 13 %
WBC: 10.3 10*3/uL — AB (ref 4.6–10.2)

## 2018-04-01 LAB — POCT URINE PREGNANCY: Preg Test, Ur: NEGATIVE

## 2018-04-01 MED ORDER — FLUCONAZOLE 150 MG PO TABS: 150.0000 mg | ORAL_TABLET | Freq: Once | ORAL | 0 refills | Status: AC

## 2018-04-01 MED ORDER — POLYETHYLENE GLYCOL 3350 17 GM/SCOOP PO POWD: 17.0000 g | Freq: Every day | ORAL | 1 refills | Status: DC

## 2018-04-01 NOTE — Patient Instructions (Addendum)
     IF you received an x-ray today, you will receive an invoice from [facility]. Please contact [facility] at [phone] with questions or concerns regarding your invoice.   IF you received labwork today, you will receive an invoice from [facility]. Please contact [facility] at [phone] with questions or concerns regarding your invoice.   Our billing staff will not be able to assist you with questions regarding bills from these companies.  You will be contacted with the lab results as soon as they are available. The fastest way to get your results is to activate your My Chart account. Instructions are located on the last page of this paperwork. If you have not heard from us regarding the results in 2 weeks, please contact this office.      Constipation, Adult Constipation is when a person has fewer bowel movements in a week than normal, has difficulty having a bowel movement, or has stools that are dry, hard, or larger than normal. Constipation may be caused by an underlying condition. It may become worse with age if a person takes certain medicines and does not take in enough fluids. Follow these instructions at home: Eating and drinking   Eat foods that have a lot of fiber, such as fresh fruits and vegetables, whole grains, and beans.  Limit foods that are high in fat, low in fiber, or overly processed, such as french fries, hamburgers, cookies, candies, and soda.  Drink enough fluid to keep your urine clear or pale yellow. General instructions  Exercise regularly or as told by your health care provider.  Go to the restroom when you have the urge to go. Do not hold it in.  Take over-the-counter and prescription medicines only as told by your health care provider. These include any fiber supplements.  Practice pelvic floor retraining exercises, such as deep breathing while relaxing the lower abdomen and pelvic floor relaxation during bowel movements.  Watch  your condition for any changes.  Keep all follow-up visits as told by your health care provider. This is important. Contact a health care provider if:  You have pain that gets worse.  You have a fever.  You do not have a bowel movement after 4 days.  You vomit.  You are not hungry.  You lose weight.  You are bleeding from the anus.  You have thin, pencil-like stools. Get help right away if:  You have a fever and your symptoms suddenly get worse.  You leak stool or have blood in your stool.  Your abdomen is bloated.  You have severe pain in your abdomen.  You feel dizzy or you faint. This information is not intended to replace advice given to you by your health care provider. Make sure you discuss any questions you have with your health care provider. Document Released: 09/11/2004 Document Revised: 07/03/2016 Document Reviewed: 06/03/2016 Elsevier Interactive Patient Education  2018 Elsevier Inc.  

## 2018-04-01 NOTE — Progress Notes (Signed)
4/5/201912:26 PM  Barbara Finley [birth year], 32 y.o. female [id number]  Chief Complaint  Patient presents with  . Abdominal Pain    Has IUD inserted, feeling a lot of pressure in vaginal area. Thinks it may have become lodge Pain scale 8    HPI:   Patient is a 32 y.o. female with past medical history significant for IUD in place who presents today for 2 days of pelvic pain.  IUD was placed in 07/2015 Patient reports 2 days of right/suprapubic intense period-like cramping pain that has happens during the morning, preceding a normal BM. She reports pain is so intense she had to lie in fetal position on her bathroom floor this morning. She reports associated rectal pain and mild nausea. She states that for the rest of the day the pain is significantly much more subdued. She denies any fever, chills, blood or mucous in stool, vaginal discharge. She denies any new sex partners. She denies any diarrhea or constipation. She denies any changes in appetite. She denies any dysuria or hematuria. She denies any similar episodes in the past.  [facility] 04/01/2018 09/23/2015  Decreased Interest 0 0  Down, Depressed, Hopeless 0 0  PHQ - 2 Score 0 0    No Known Allergies  Prior to Admission medications   Medication Sig Start Date End Date Taking? Authorizing Provider  fluticasone (FLONASE) 50 MCG/ACT nasal spray Place 2 sprays into both nostrils daily. 02/25/17  Yes Shambley, Melody N, CNM  levonorgestrel (MIRENA) 20 MCG/24HR IUD 1 each by Intrauterine route once.   Yes [provider]    Past Medical History:  Diagnosis Date  . Bacterial vaginosis   . Environmental allergies   . Yeast infection     Past Surgical History:  Procedure Laterality Date  . TONSILLECTOMY      Social History   Tobacco Use  . Smoking status: Never Smoker  . Smokeless tobacco: Never Used  Substance Use Topics  . Alcohol use: No    No family history on file.  ROS Per  hpi  OBJECTIVE:  Blood pressure 120/80, pulse 95, temperature 98.6 F (37 C), temperature source Oral, height 5\' 6"  (1.676 m), weight 150 lb (68 kg), SpO2 98 %.  Physical Exam  Constitutional: She is oriented to person, place, and time and well-developed, well-nourished, and in no distress.  HENT:  Head: Normocephalic and atraumatic.  Mouth/Throat: Oropharynx is clear and moist. No oropharyngeal exudate.  Eyes: Pupils are equal, round, and reactive to light. EOM are normal. No scleral icterus.  Neck: Neck supple.  Cardiovascular: Normal rate, regular rhythm and normal heart sounds. Exam reveals no gallop and no friction rub.  No murmur heard. Pulmonary/Chest: Effort normal and breath sounds normal. She has no wheezes. She has no rales.  Abdominal: Soft. Bowel sounds are normal. There is tenderness in the right lower quadrant and suprapubic area. There is no rebound, no guarding and no tenderness at McBurney's point.  Negative rovsing, psoas and obturator signs  Genitourinary: Rectal exam shows external hemorrhoid. Rectal exam shows no internal hemorrhoid, no fissure, no mass and no tenderness. Uterus is not enlarged, not fixed and not tender.  Cervix is not fixed. Cervix exhibits no motion tenderness, no lesion and no tenderness. Right adnexum displays tenderness. Right adnexum displays no mass. Left adnexum displays no mass and no tenderness. Vulva exhibits no erythema, no lesion and no rash. Vagina exhibits no lesion. Curdy  white and vaginal discharge found.  Musculoskeletal: She  exhibits no edema.  Neurological: She is alert and oriented to person, place, and time. Gait normal.  Skin: Skin is warm and dry.      Results for orders placed or performed in visit on 04/01/18 (from the past 24 hour(s))  POCT urine pregnancy     Status: None   Collection Time: 04/01/18 12:52 PM  Result Value Ref Range   Preg Test, Ur Negative Negative  POCT CBC     Status: Abnormal   Collection Time:  04/01/18 12:59 PM  Result Value Ref Range   WBC 10.3 (A) 4.6 - 10.2 K/uL   Lymph, poc 2.3 0.6 - 3.4   POC LYMPH PERCENT 22.5 10 - 50 %L   MID (cbc) 0.4 0 - 0.9   POC MID % 3.6 0 - 12 %M   POC Granulocyte 7.6 (A) 2 - 6.9   Granulocyte percent 73.9 37 - 80 %G   RBC 4.66 4.04 - 5.48 M/uL   Hemoglobin 14.5 12.2 - 16.2 g/dL   HCT, POC 16.1 09.6 - 47.9 %   MCV 90.3 80 - 97 fL   MCH, POC 31.1 27 - 31.2 pg   MCHC 34.5 31.8 - 35.4 g/dL   RDW, POC 04.5 %   Platelet Count, POC 272 142 - 424 K/uL   MPV 7.6 0 - 99.8 fL    ASSESSMENT and PLAN  1. RLQ abdominal pain Patient clinically stable in clinic at this time. Normal H/H and mildly elevated WBC. Exam not suggestive of appendicitis, favoring gyn etiology, neg UPT, ruptured/hemorrhagic cyst most likely. No CMT nor evidence of cervicitis on exam, PID less likely. No known diverticulosis. Discussed RTC/ER precautions. Workup per below.  - POCT CBC - POCT urine pregnancy - GC/Chlamydia Probe Amp([facility]) - WET PREP FOR TRICH, YEAST, CLUE - US PELVIC COMPLETE WITH TRANSVAGINAL; Future - Comprehensive metabolic panel  2. Vaginal discharge  3. Rectal pain  Other orders - fluconazole (DIFLUCAN) 150 MG tablet; Take 1 tablet (150 mg total) by mouth once for 1 dose. - polyethylene glycol powder (GLYCOLAX/MIRALAX) powder; Take 17 g by mouth daily. - WET PREP FOR TRICH, YEAST, CLUE - GC/Chlamydia Probe Amp  Return in about 2 weeks (around 04/15/2018).    Myles Lipps, MD Primary Care at [facility] [address] [place], [place] [address] Ph.  [phone] Fax [phone]

## 2018-04-05 ENCOUNTER — Other Ambulatory Visit: Payer: Self-pay | Admitting: *Deleted

## 2018-04-05 ENCOUNTER — Encounter: Payer: Self-pay | Admitting: Obstetrics and Gynecology

## 2018-04-05 LAB — COMPREHENSIVE METABOLIC PANEL
ALT: 74 IU/L — ABNORMAL HIGH (ref 0–32)
AST: 37 IU/L (ref 0–40)
Albumin/Globulin Ratio: 2.1 (ref 1.2–2.2)
Albumin: 4.8 g/dL (ref 3.5–5.5)
Alkaline Phosphatase: 41 IU/L (ref 39–117)
BUN/Creatinine Ratio: 9 (ref 9–23)
BUN: 9 mg/dL (ref 6–20)
Bilirubin Total: 0.6 mg/dL (ref 0.0–1.2)
CO2: 22 mmol/L (ref 20–29)
Calcium: 9.7 mg/dL (ref 8.7–10.2)
Chloride: 103 mmol/L (ref 96–106)
Creatinine, Ser: 1.04 mg/dL — ABNORMAL HIGH (ref 0.57–1.00)
GFR calc Af Amer: 84 mL/min/{1.73_m2} (ref >59)
GFR calc non Af Amer: 73 mL/min/{1.73_m2} (ref >59)
Globulin, Total: 2.3 g/dL (ref 1.5–4.5)
Glucose: 87 mg/dL (ref 65–99)
Potassium: 4 mmol/L (ref 3.5–5.2)
Sodium: 140 mmol/L (ref 134–144)
Total Protein: 7.1 g/dL (ref 6.0–8.5)

## 2018-04-05 LAB — WET PREP FOR TRICH, YEAST, CLUE
Clue Cell Exam: NEGATIVE
Trichomonas Exam: NEGATIVE
Yeast Exam: NEGATIVE

## 2018-04-05 LAB — GC/CHLAMYDIA PROBE AMP
Chlamydia trachomatis, NAA: NEGATIVE
Neisseria gonorrhoeae by PCR: NEGATIVE

## 2018-04-05 MED ORDER — AZITHROMYCIN 250 MG PO TABS: 250.0000 mg | ORAL_TABLET | Freq: Once | ORAL | 0 refills | Status: AC

## 2018-04-06 ENCOUNTER — Encounter: Payer: Self-pay | Admitting: Family Medicine

## 2018-04-15 ENCOUNTER — Ambulatory Visit: Payer: 59 | Admitting: Family Medicine

## 2018-04-25 ENCOUNTER — Encounter: Payer: Self-pay | Admitting: Obstetrics and Gynecology

## 2018-04-25 ENCOUNTER — Other Ambulatory Visit: Payer: Self-pay | Admitting: *Deleted

## 2018-04-25 MED ORDER — FLUCONAZOLE 150 MG PO TABS: 150.0000 mg | ORAL_TABLET | Freq: Once | ORAL | 3 refills | Status: AC

## 2018-04-26 ENCOUNTER — Other Ambulatory Visit: Payer: Self-pay | Admitting: *Deleted

## 2018-04-26 DIAGNOSIS — Z9109 Other allergy status, other than to drugs and biological substances: Secondary | ICD-10-CM

## 2018-04-26 MED ORDER — FLUTICASONE PROPIONATE 50 MCG/ACT NA SUSP
2.0000 | Freq: Every day | NASAL | 4 refills | Status: DC
Start: 2018-04-26 — End: 2020-07-04

## 2018-04-26 MED ORDER — FLUCONAZOLE 150 MG PO TABS: 150.0000 mg | ORAL_TABLET | Freq: Once | ORAL | 3 refills | Status: AC

## 2018-06-21 ENCOUNTER — Encounter: Payer: Self-pay | Admitting: Obstetrics and Gynecology

## 2018-06-21 ENCOUNTER — Other Ambulatory Visit: Payer: Self-pay | Admitting: *Deleted

## 2018-06-21 MED ORDER — METRONIDAZOLE 0.75 % VA GEL: 1.0000 | Freq: Two times a day (BID) | VAGINAL | 0 refills | Status: DC

## 2018-06-22 ENCOUNTER — Other Ambulatory Visit: Payer: Self-pay | Admitting: *Deleted

## 2018-06-22 MED ORDER — METRONIDAZOLE 0.75 % VA GEL: 1.0000 | Freq: Two times a day (BID) | VAGINAL | 0 refills | Status: DC

## 2018-07-21 ENCOUNTER — Other Ambulatory Visit: Payer: Self-pay | Admitting: *Deleted

## 2018-07-21 ENCOUNTER — Encounter: Payer: Self-pay | Admitting: Obstetrics and Gynecology

## 2018-07-21 MED ORDER — CLINDAMYCIN PHOSPHATE (1 DOSE) 2 % VA CREA: 1.0000 "application " | TOPICAL_CREAM | Freq: Two times a day (BID) | VAGINAL | 4 refills | Status: DC

## 2018-08-16 ENCOUNTER — Ambulatory Visit: Payer: 59 | Admitting: Obstetrics and Gynecology

## 2018-08-16 ENCOUNTER — Encounter: Payer: Self-pay | Admitting: Obstetrics and Gynecology

## 2018-08-16 VITALS — BP 124/84 | HR 73 | Ht 67.0 in | Wt 155.8 lb

## 2018-08-16 DIAGNOSIS — B9689 Other specified bacterial agents as the cause of diseases classified elsewhere: Secondary | ICD-10-CM

## 2018-08-16 DIAGNOSIS — N760 Acute vaginitis: Secondary | ICD-10-CM

## 2018-08-16 NOTE — Progress Notes (Signed)
  Subjective:     Patient ID: Oletta CohnNichole Chicas, female   DOB: 07/28/1986, 32 y.o.   MRN: [id number]  HPI Here due to recurring BV after sex. Same female partner with IUD use. Has been using Clindesse as needed or taking flagyl po, but symptoms return after each act of intercourse. She states he has been pulling out most of the time. They do have oral sex. Not using any lubricant. And no menses with the mirena IUD.  Denies vaginal itching or irritation.  Used Clindesse a week ago.   Review of Systems negative except stated above.    Objective:   Physical Exam A&Ox4 Well groomed female in no distress Blood pressure 124/84, pulse 73, height 5\' 7"  (1.702 m), weight 155 lb 12.8 oz (70.7 kg). Pelvic exam: normal external genitalia, vulva, vagina, cervix, uterus and adnexa.   IUS string noted. Assessment:     recurrent postcoital BV    Plan:     Will try postcoital boric acid capsule- instructed on use and how to make. NuSwab sent in and will follow up accordingly  Melody Shambley,CNM

## 2018-08-16 NOTE — Patient Instructions (Signed)
Bacterial Vaginosis Bacterial vaginosis is a vaginal infection that occurs when the normal balance of bacteria in the vagina is disrupted. It results from an overgrowth of certain bacteria. This is the most common vaginal infection among women ages 15-44. Because bacterial vaginosis increases your risk for STIs (sexually transmitted infections), getting treated can help reduce your risk for chlamydia, gonorrhea, herpes, and HIV (human immunodeficiency virus). Treatment is also important for preventing complications in pregnant women, because this condition can cause an early (premature) delivery. What are the causes? This condition is caused by an increase in harmful bacteria that are normally present in small amounts in the vagina. However, the reason that the condition develops is not fully understood. What increases the risk? The following factors may make you more likely to develop this condition:  Having a new sexual partner or multiple sexual partners.  Having unprotected sex.  Douching.  Having an intrauterine device (IUD).  Smoking.  Drug and alcohol abuse.  Taking certain antibiotic medicines.  Being pregnant.  You cannot get bacterial vaginosis from toilet seats, bedding, swimming pools, or contact with objects around you. What are the signs or symptoms? Symptoms of this condition include:  Grey or white vaginal discharge. The discharge can also be watery or foamy.  A fish-like odor with discharge, especially after sexual intercourse or during menstruation.  Itching in and around the vagina.  Burning or pain with urination.  Some women with bacterial vaginosis have no signs or symptoms. How is this diagnosed? This condition is diagnosed based on:  Your medical history.  A physical exam of the vagina.  Testing a sample of vaginal fluid under a microscope to look for a large amount of bad bacteria or abnormal cells. Your health care provider may use a cotton swab  or a small wooden spatula to collect the sample.  How is this treated? This condition is treated with antibiotics. These may be given as a pill, a vaginal cream, or a medicine that is put into the vagina (suppository). If the condition comes back after treatment, a second round of antibiotics may be needed. Follow these instructions at home: Medicines  Take over-the-counter and prescription medicines only as told by your health care provider.  Take or use your antibiotic as told by your health care provider. Do not stop taking or using the antibiotic even if you start to feel better. General instructions  If you have a female sexual partner, tell her that you have a vaginal infection. She should see her health care provider and be treated if she has symptoms. If you have a female sexual partner, he does not need treatment.  During treatment: ? Avoid sexual activity until you finish treatment. ? Do not douche. ? Avoid alcohol as directed by your health care provider. ? Avoid breastfeeding as directed by your health care provider.  Drink enough water and fluids to keep your urine clear or pale yellow.  Keep the area around your vagina and rectum clean. ? Wash the area daily with warm water. ? Wipe yourself from front to back after using the toilet.  Keep all follow-up visits as told by your health care provider. This is important. How is this prevented?  Do not douche.  Wash the outside of your vagina with warm water only.  Use protection when having sex. This includes latex condoms and dental dams.  Limit how many sexual partners you have. To help prevent bacterial vaginosis, it is best to have sex with just   one partner (monogamous).  Make sure you and your sexual partner are tested for STIs.  Wear cotton or cotton-lined underwear.  Avoid wearing tight pants and pantyhose, especially during summer.  Limit the amount of alcohol that you drink.  Do not use any products that  contain nicotine or tobacco, such as cigarettes and e-cigarettes. If you need help quitting, ask your health care provider.  Do not use illegal drugs. Where to find more information:  Centers for Disease Control and Prevention: www.cdc.gov/std  American Sexual Health Association (ASHA): www.ashastd.org  U.S. Department of Health and Human Services, Office on Women's Health: www.womenshealth.gov/ or https://www.womenshealth.gov/a-z-topics/bacterial-vaginosis Contact a health care provider if:  Your symptoms do not improve, even after treatment.  You have more discharge or pain when urinating.  You have a fever.  You have pain in your abdomen.  You have pain during sex.  You have vaginal bleeding between periods. Summary  Bacterial vaginosis is a vaginal infection that occurs when the normal balance of bacteria in the vagina is disrupted.  Because bacterial vaginosis increases your risk for STIs (sexually transmitted infections), getting treated can help reduce your risk for chlamydia, gonorrhea, herpes, and HIV (human immunodeficiency virus). Treatment is also important for preventing complications in pregnant women, because the condition can cause an early (premature) delivery.  This condition is treated with antibiotic medicines. These may be given as a pill, a vaginal cream, or a medicine that is put into the vagina (suppository). This information is not intended to replace advice given to you by your health care provider. Make sure you discuss any questions you have with your health care provider. Document Released: 12/14/2005 Document Revised: 04/19/2017 Document Reviewed: 08/29/2016 Elsevier Interactive Patient Education  2018 Elsevier Inc.  

## 2018-08-19 LAB — NUSWAB VAGINITIS PLUS (VG+)
Atopobium vaginae: HIGH Score — AB
BVAB 2: HIGH {score} — AB
CANDIDA GLABRATA, NAA: NEGATIVE
Candida albicans, NAA: NEGATIVE
Chlamydia trachomatis, NAA: NEGATIVE
Megasphaera 1: HIGH Score — AB
Neisseria gonorrhoeae, NAA: NEGATIVE
TRICH VAG BY NAA: NEGATIVE

## 2018-08-23 ENCOUNTER — Other Ambulatory Visit: Payer: Self-pay | Admitting: Obstetrics and Gynecology

## 2018-08-23 MED ORDER — CLINDAMYCIN PHOSPHATE (1 DOSE) 2 % VA CREA: 1.0000 "application " | TOPICAL_CREAM | Freq: Two times a day (BID) | VAGINAL | 4 refills | Status: DC

## 2018-10-04 IMAGING — US US PELVIS COMPLETE TRANSABD/TRANSVAG
1 series · 13 of 25 positions shown · non-contrast
Comparison: None

CLINICAL DATA: RIGHT pelvic pain for 2 days.  History of IUD.

EXAM:
TRANSABDOMINAL AND TRANSVAGINAL ULTRASOUND OF PELVIS
TECHNIQUE: Both transabdominal and transvaginal ultrasound examinations of the
pelvis were performed. Transabdominal technique was performed for
global imaging of the pelvis including uterus, ovaries, adnexal
regions, and pelvic cul-de-sac. It was necessary to proceed with
endovaginal exam following the transabdominal exam to visualize the
endometrium and adnexa.

[Series 1: us pelvis complete transabd/transvag · 0.25mm/px · 13 of 98 slices shown]
[im 1/98]
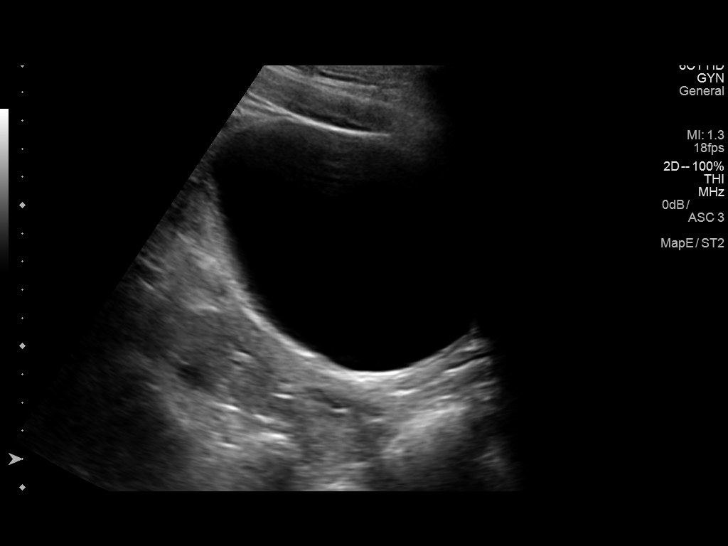
[im 9/98]
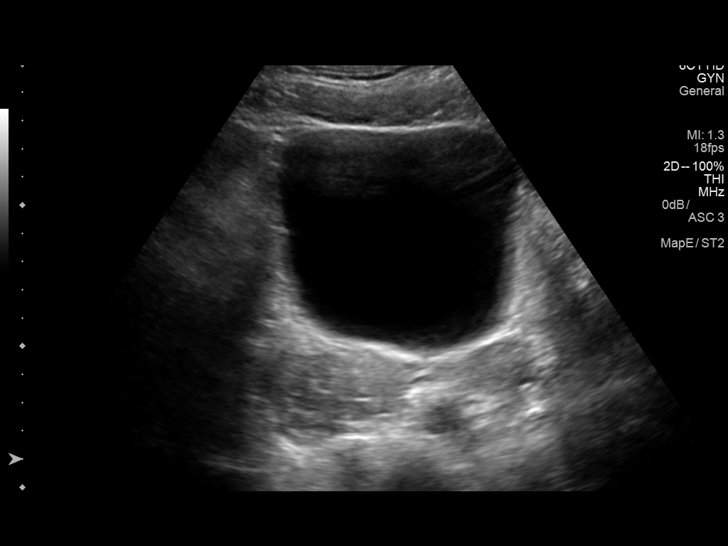
[im 17/98]
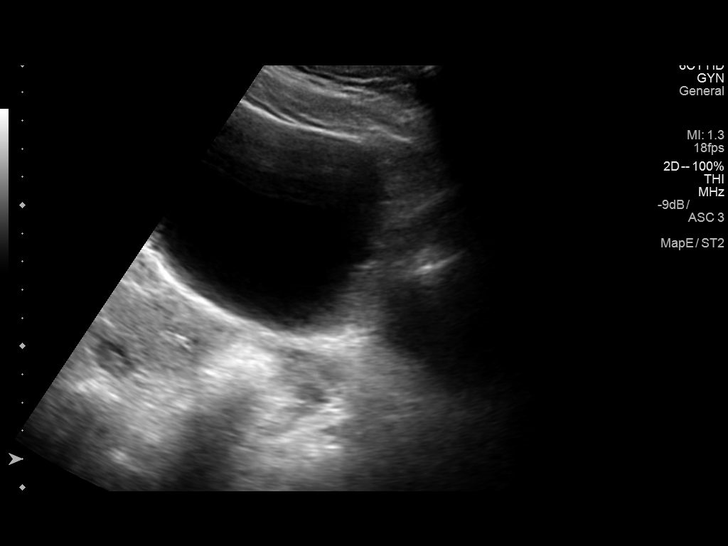
[im 25/98]
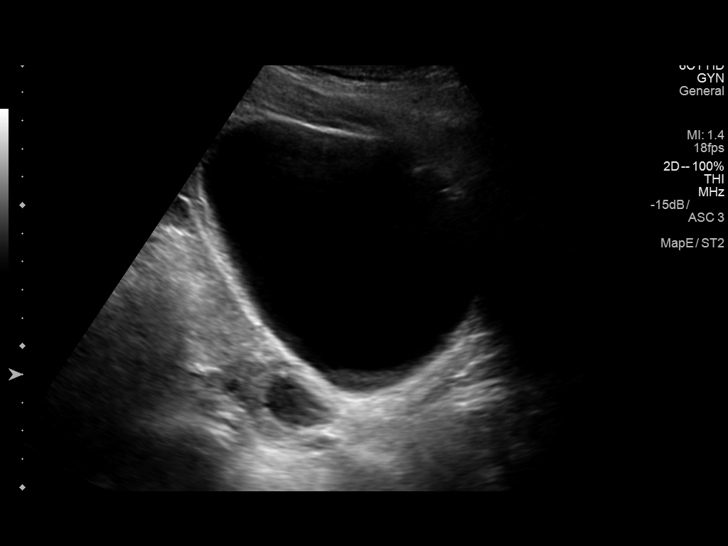
[im 33/98]
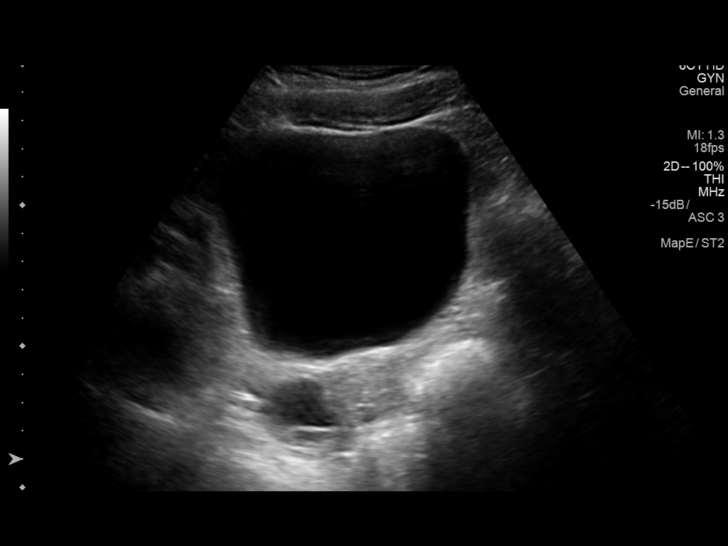
[im 41/98]
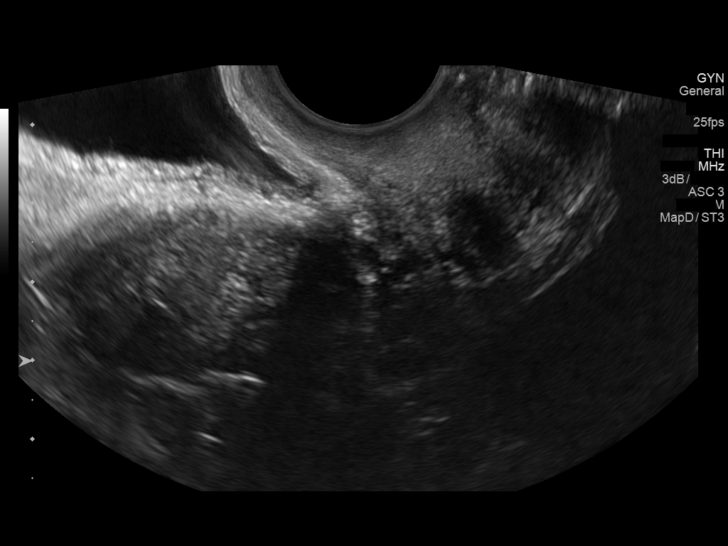
[im 49/98]
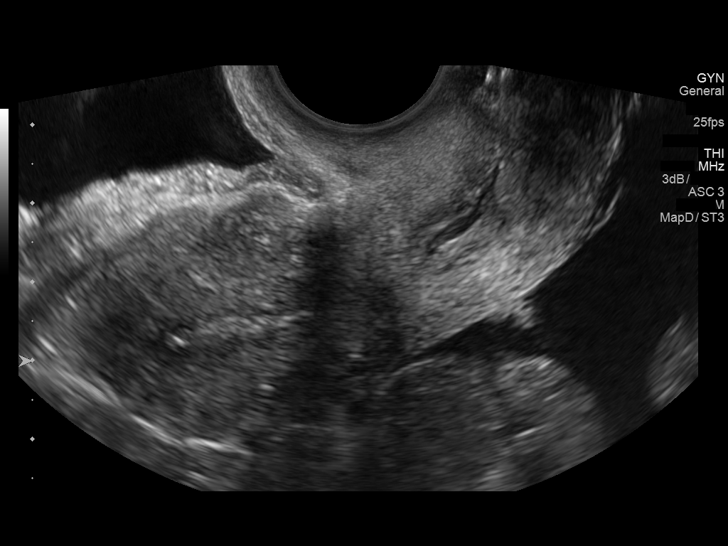
[im 57/98]
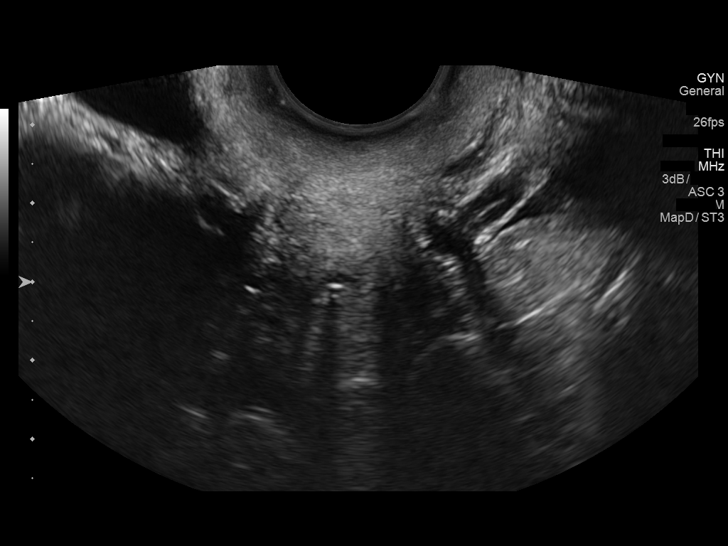
[im 65/98]
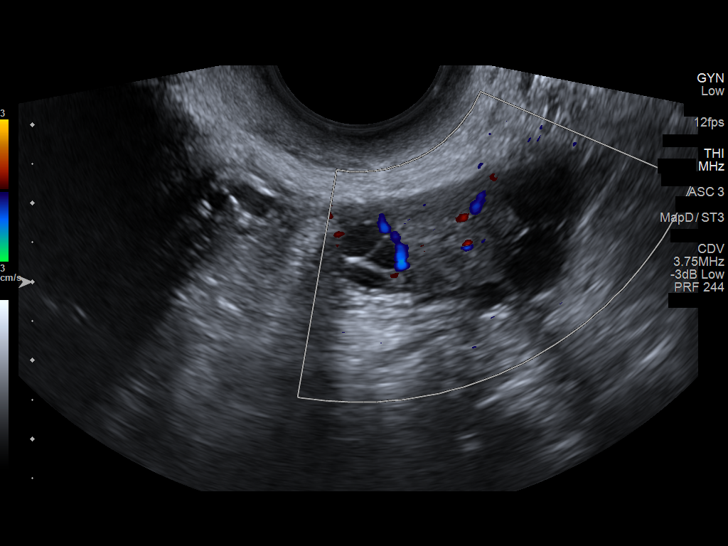
[im 73/98]
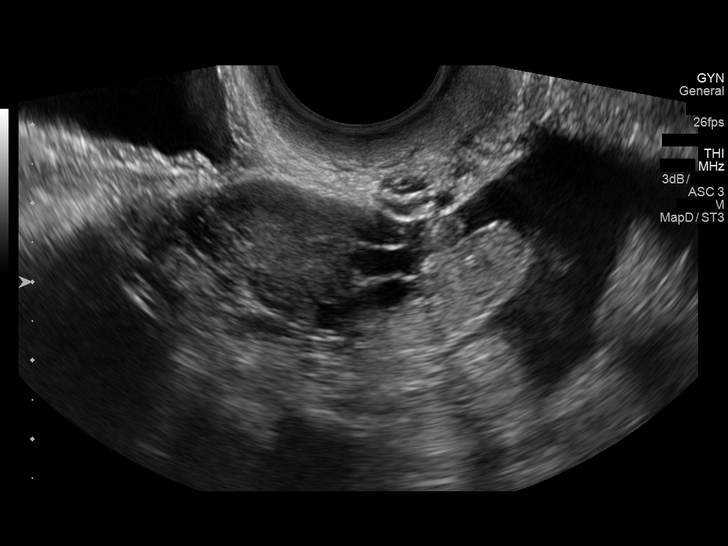
[im 81/98]
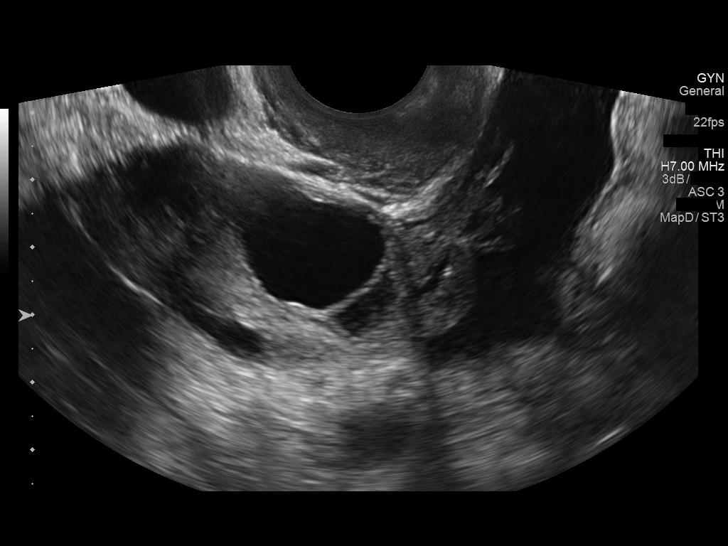
[im 89/98]
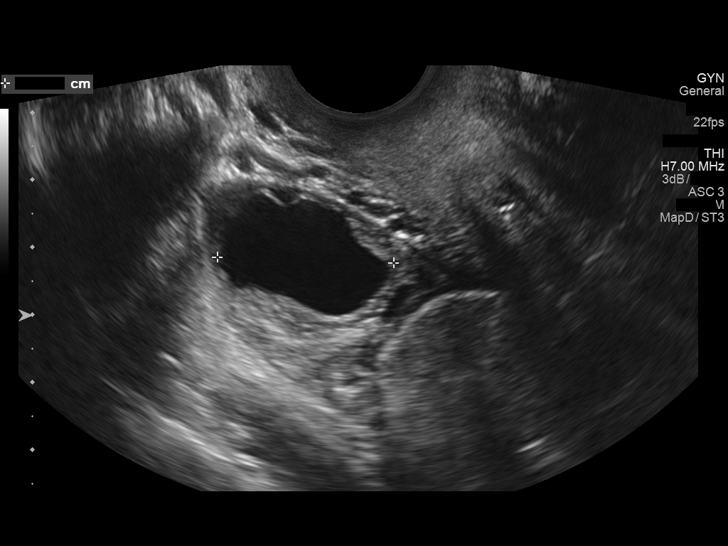
[im 98/98]
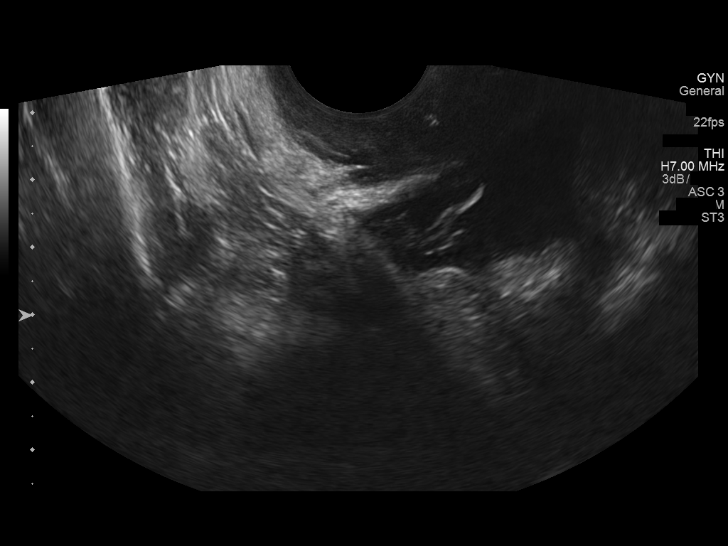

[13 of 25 positions shown; findings below may reference images not displayed]

FINDINGS: Uterus

Measurements: 8.4 x 2.8 x 4.2 cm. No fibroids or other mass
visualized.

Endometrium

Thickness: 5 mm.  Linear echogenic IUD and centrally endometrium.

Right ovary

Measurements: 5.1 x 3 x 3.3 cm. Normal appearance/no adnexal mass.
2.7 cm anechoic dominant follicle/cyst with mild peripheral
hypervascularity, lobulated margins.

Left ovary

Measurements: 3.3 x 1.7 x 2.5 cm. Normal appearance/no adnexal mass.

Other findings

Moderate amount of free fluid in the pelvis.
IMPRESSION: 1. Lobulated RIGHT 2.7 cm dominant follicle/cyst; given morphology,
this has likely ruptured. Moderate amount of free fluid in the
pelvis.
2. IUD in central uterus.

## 2018-11-07 ENCOUNTER — Other Ambulatory Visit: Payer: Self-pay | Admitting: *Deleted

## 2018-11-07 MED ORDER — SECNIDAZOLE 2 G PO PACK: 1.0000 | PACK | Freq: Every day | ORAL | 3 refills | Status: DC

## 2018-11-23 ENCOUNTER — Other Ambulatory Visit: Payer: Self-pay | Admitting: *Deleted

## 2018-11-23 MED ORDER — METRONIDAZOLE 0.75 % VA GEL
1.0000 | Freq: Two times a day (BID) | VAGINAL | 4 refills | Status: DC
Start: 2018-11-23 — End: 2019-01-25

## 2019-01-24 ENCOUNTER — Other Ambulatory Visit: Payer: Self-pay | Admitting: *Deleted

## 2019-01-25 ENCOUNTER — Other Ambulatory Visit: Payer: Self-pay | Admitting: *Deleted

## 2019-01-25 MED ORDER — METRONIDAZOLE 0.75 % VA GEL
1.0000 | Freq: Two times a day (BID) | VAGINAL | 4 refills | Status: DC
Start: 2019-01-25 — End: 2020-07-04

## 2019-02-06 ENCOUNTER — Other Ambulatory Visit: Payer: Self-pay | Admitting: *Deleted

## 2019-02-06 MED ORDER — AZITHROMYCIN 250 MG PO TABS: 250.0000 mg | ORAL_TABLET | Freq: Every day | ORAL | 0 refills | Status: DC

## 2019-03-16 ENCOUNTER — Other Ambulatory Visit: Payer: Self-pay | Admitting: *Deleted

## 2019-03-16 MED ORDER — METRONIDAZOLE 500 MG PO TABS: 500.0000 mg | ORAL_TABLET | Freq: Two times a day (BID) | ORAL | 2 refills | Status: DC

## 2020-02-27 ENCOUNTER — Telehealth: Payer: Self-pay | Admitting: Certified Nurse Midwife

## 2020-02-27 NOTE — Telephone Encounter (Signed)
Pt called in and stated she doesn't know how long her iud has been in the pt is requesting a call back. Please advise

## 2020-02-28 NOTE — Telephone Encounter (Signed)
Called and spoke with patient.  Patient unsure of when her Mirena IUD expired but is sure it has been longer than 5 years when it was inserted.  Patient requesting IUD removal appointment and wants to start taking OCP.  Appointment scheduled for tomorrow at 8:45 am for IUD removal and to discuss starting OCP and patient verbalized understanding.

## 2020-02-29 ENCOUNTER — Telehealth: Payer: Self-pay

## 2020-02-29 ENCOUNTER — Encounter: Payer: Self-pay | Admitting: Certified Nurse Midwife

## 2020-02-29 ENCOUNTER — Ambulatory Visit (INDEPENDENT_AMBULATORY_CARE_PROVIDER_SITE_OTHER): Payer: 59 | Admitting: Certified Nurse Midwife

## 2020-02-29 ENCOUNTER — Other Ambulatory Visit: Payer: Self-pay

## 2020-02-29 VITALS — BP 116/73 | HR 61 | Ht 67.0 in | Wt 159.3 lb

## 2020-02-29 DIAGNOSIS — Z30432 Encounter for removal of intrauterine contraceptive device: Secondary | ICD-10-CM

## 2020-02-29 NOTE — Progress Notes (Signed)
Barbara Finley is a 34 y.o. year old [id number] Caucasian female who presents for removal of a Mirena IUD.    No LMP recorded. (Menstrual status: IUD). BP 116/73   Pulse 61   Ht 5\' 7"  (1.702 m)   Wt 159 lb 5 oz (72.3 kg)   BMI 24.95 kg/m   Time out was performed.  A small plastic speculum was placed in the vagina.  The cervix was visualized, and the strings were visible. They were grasped and the Mirena was easily removed intact without complications.   Samples of Lo Loestrin provided.   Reviewed red flag symptoms and when to call.   RTC x 4 months ANNUAL EXAM and PAP or sooner if needed.    , CNM [facility] 02/29/20 1:06 PM

## 2020-02-29 NOTE — Patient Instructions (Signed)
Preventive Care 21-32 Years Old, Female Preventive care refers to visits with your health care provider and lifestyle choices that can promote health and wellness. This includes:  A yearly physical exam. This may also be called an annual well check.  Regular dental visits and eye exams.  Immunizations.  Screening for certain conditions.  Healthy lifestyle choices, such as eating a healthy diet, getting regular exercise, not using drugs or products that contain nicotine and tobacco, and limiting alcohol use. What can I expect for my preventive care visit? Physical exam Your health care provider will check your:  Height and weight. This may be used to calculate body mass index (BMI), which tells if you are at a healthy weight.  Heart rate and blood pressure.  Skin for abnormal spots. Counseling Your health care provider may ask you questions about your:  Alcohol, tobacco, and drug use.  Emotional well-being.  Home and relationship well-being.  Sexual activity.  Eating habits.  Work and work environment.  Method of birth control.  Menstrual cycle.  Pregnancy history. What immunizations do I need?  Influenza (flu) vaccine  This is recommended every year. Tetanus, diphtheria, and pertussis (Tdap) vaccine  You may need a Td booster every 10 years. Varicella (chickenpox) vaccine  You may need this if you have not been vaccinated. Human papillomavirus (HPV) vaccine  If recommended by your health care provider, you may need three doses over 6 months. Measles, mumps, and rubella (MMR) vaccine  You may need at least one dose of MMR. You may also need a second dose. Meningococcal conjugate (MenACWY) vaccine  One dose is recommended if you are age 19-21 years and a first-year college student living in a residence hall, or if you have one of several medical conditions. You may also need additional booster doses. Pneumococcal conjugate (PCV13) vaccine  You may need  this if you have certain conditions and were not previously vaccinated. Pneumococcal polysaccharide (PPSV23) vaccine  You may need one or two doses if you smoke cigarettes or if you have certain conditions. Hepatitis A vaccine  You may need this if you have certain conditions or if you travel or work in places where you may be exposed to hepatitis A. Hepatitis B vaccine  You may need this if you have certain conditions or if you travel or work in places where you may be exposed to hepatitis B. Haemophilus influenzae type b (Hib) vaccine  You may need this if you have certain conditions. You may receive vaccines as individual doses or as more than one vaccine together in one shot (combination vaccines). Talk with your health care provider about the risks and benefits of combination vaccines. What tests do I need?  Blood tests  Lipid and cholesterol levels. These may be checked every 5 years starting at age 20.  Hepatitis C test.  Hepatitis B test. Screening  Diabetes screening. This is done by checking your blood sugar (glucose) after you have not eaten for a while (fasting).  Sexually transmitted disease (STD) testing.  BRCA-related cancer screening. This may be done if you have a family history of breast, ovarian, tubal, or peritoneal cancers.  Pelvic exam and Pap test. This may be done every 3 years starting at age 21. Starting at age 30, this may be done every 5 years if you have a Pap test in combination with an HPV test. Talk with your health care provider about your test results, treatment options, and if necessary, the need for more tests.   Follow these instructions at home: Eating and drinking   Eat a diet that includes fresh fruits and vegetables, whole grains, lean protein, and low-fat dairy.  Take vitamin and mineral supplements as recommended by your health care provider.  Do not drink alcohol if: ? Your health care provider tells you not to drink. ? You are  pregnant, may be pregnant, or are planning to become pregnant.  If you drink alcohol: ? Limit how much you have to 0-1 drink a day. ? Be aware of how much alcohol is in your drink. In the [place] one drink equals one 12 oz bottle of beer (355 mL), one 5 oz glass of wine (148 mL), or one 1 oz glass of hard liquor (44 mL). Lifestyle  Take daily care of your teeth and gums.  Stay active. Exercise for at least 30 minutes on 5 or more days each week.  Do not use any products that contain nicotine or tobacco, such as cigarettes, e-cigarettes, and chewing tobacco. If you need help quitting, ask your health care provider.  If you are sexually active, practice safe sex. Use a condom or other form of birth control (contraception) in order to prevent pregnancy and STIs (sexually transmitted infections). If you plan to become pregnant, see your health care provider for a preconception visit. What's next?  Visit your health care provider once a year for a well check visit.  Ask your health care provider how often you should have your eyes and teeth checked.  Stay up to date on all vaccines. This information is not intended to replace advice given to you by your health care provider. Make sure you discuss any questions you have with your health care provider. Document Revised: 08/25/2018 Document Reviewed: 08/25/2018 Elsevier Patient Education  2020 [employer]vier [employer].   Ethinyl Estradiol; Norethindrone Acetate; Ferrous fumarate tablets or capsules What is this medicine? ETHINYL ESTRADIOL; NORETHINDRONE ACETATE; FERROUS FUMARATE (ETH in il es tra DYE ole; nor eth IN drone AS e tate; FER us FUE ma rate) is an oral contraceptive. The products combine two types of female hormones, an estrogen and a progestin. They are used to prevent ovulation and pregnancy. Some products are also used to treat acne in females. This medicine may be used for other purposes; ask your health care provider or pharmacist if you  have questions. COMMON BRAND NAME(S): Aurovela 24 Fe 1/20, Aurovela Fe, Blisovi 24 Fe, Blisovi Fe, Estrostep Fe, Gildess 24 Fe, Gildess Fe 1.5/30, Gildess Fe 1/20, Hailey 24 Fe, Hailey Fe 1.5/30, Junel Fe 1.5/30, Junel Fe 1/20, Junel Fe 24, Larin Fe, Lo Loestrin Fe, Loestrin 24 Fe, Loestrin FE 1.5/30, Loestrin FE 1/20, Lomedia 24 Fe, Microgestin 24 Fe, Microgestin Fe 1.5/30, Microgestin Fe 1/20, Tarina 24 Fe, Tarina Fe 1/20, Taytulla, Tilia Fe, Tri-Legest Fe What should I tell my health care provider before I take this medicine? They need to know if you have any of these conditions:  abnormal vaginal bleeding  blood vessel disease  breast, cervical, endometrial, ovarian, liver, or uterine cancer  diabetes  gallbladder disease  heart disease or recent heart attack  high blood pressure  high cholesterol  history of blood clots  kidney disease  liver disease  migraine headaches  smoke tobacco  stroke  systemic lupus erythematosus (SLE)  an unusual or allergic reaction to estrogens, progestins, other medicines, foods, dyes, or preservatives  pregnant or trying to get pregnant  breast-feeding How should I use this medicine? Take this medicine by mouth. To   reduce nausea, this medicine may be taken with food. Follow the directions on the prescription label. Take this medicine at the same time each day and in the order directed on the package. Do not take your medicine more often than directed. A patient package insert for the product will be given with each prescription and refill. Read this sheet carefully each time. The sheet may change frequently. Contact your pediatrician regarding the use of this medicine in children. Special care may be needed. This medicine has been used in female children who have started having menstrual periods. Overdosage: If you think you have taken too much of this medicine contact a poison control center or emergency room at once. NOTE: This  medicine is only for you. Do not share this medicine with others. What if I miss a dose? If you miss a dose, refer to the patient information sheet you received with your medicine for direction. If you miss more than one pill, this medicine may not be as effective and you may need to use another form of birth control. What may interact with this medicine? Do not take this medicine with the following medication:  dasabuvir; ombitasvir; paritaprevir; ritonavir  ombitasvir; paritaprevir; ritonavir This medicine may also interact with the following medications:  acetaminophen  antibiotics or medicines for infections, especially rifampin, rifabutin, rifapentine, and griseofulvin, and possibly penicillins or tetracyclines  aprepitant  ascorbic acid (vitamin C)  atorvastatin  barbiturate medicines, such as phenobarbital  bosentan  carbamazepine  caffeine  clofibrate  cyclosporine  dantrolene  doxercalciferol  felbamate  grapefruit juice  hydrocortisone  medicines for anxiety or sleeping problems, such as diazepam or temazepam  medicines for diabetes, including pioglitazone  mineral oil  modafinil  mycophenolate  nefazodone  oxcarbazepine  phenytoin  prednisolone  ritonavir or other medicines for HIV infection or AIDS  rosuvastatin  selegiline  soy isoflavones supplements  St. John's wort  tamoxifen or raloxifene  theophylline  thyroid hormones  topiramate  warfarin This list may not describe all possible interactions. Give your health care provider a list of all the medicines, herbs, non-prescription drugs, or dietary supplements you use. Also tell them if you smoke, drink alcohol, or use illegal drugs. Some items may interact with your medicine. What should I watch for while using this medicine? Visit your doctor or health care professional for regular checks on your progress. You will need a regular breast and pelvic exam and Pap smear  while on this medicine. Use an additional method of contraception during the first cycle that you take these tablets. If you have any reason to think you are pregnant, stop taking this medicine right away and contact your doctor or health care professional. If you are taking this medicine for hormone related problems, it may take several cycles of use to see improvement in your condition. Smoking increases the risk of getting a blood clot or having a stroke while you are taking birth control pills, especially if you are more than 32 years old. You are strongly advised not to smoke. This medicine can make your body retain fluid, making your fingers, hands, or ankles swell. Your blood pressure can go up. Contact your doctor or health care professional if you feel you are retaining fluid. This medicine can make you more sensitive to the sun. Keep out of the sun. If you cannot avoid being in the sun, wear protective clothing and use sunscreen. Do not use sun lamps or tanning beds/booths. If you wear contact lenses   and notice visual changes, or if the lenses begin to feel uncomfortable, consult your eye care specialist. In some women, tenderness, swelling, or minor bleeding of the gums may occur. Notify your dentist if this happens. Brushing and flossing your teeth regularly may help limit this. See your dentist regularly and inform your dentist of the medicines you are taking. If you are going to have elective surgery, you may need to stop taking this medicine before the surgery. Consult your health care professional for advice. This medicine does not protect you against HIV infection (AIDS) or any other sexually transmitted diseases. What side effects may I notice from receiving this medicine? Side effects that you should report to your doctor or health care professional as soon as possible:  allergic reactions like skin rash, itching or hives, swelling of the face, lips, or tongue  breast tissue  changes or discharge  changes in vaginal bleeding during your period or between your periods  changes in vision  chest pain  confusion  coughing up blood  dizziness  feeling faint or lightheaded  headaches or migraines  leg, arm or groin pain  loss of balance or coordination  severe or sudden headaches  stomach pain (severe)  sudden shortness of breath  sudden numbness or weakness of the face, arm or leg  symptoms of vaginal infection like itching, irritation or unusual discharge  tenderness in the upper abdomen  trouble speaking or understanding  vomiting  yellowing of the eyes or skin Side effects that usually do not require medical attention (report to your doctor or health care professional if they continue or are bothersome):  breakthrough bleeding and spotting that continues beyond the 3 initial cycles of pills  breast tenderness  mood changes, anxiety, depression, frustration, anger, or emotional outbursts  increased sensitivity to sun or ultraviolet light  nausea  skin rash, acne, or brown spots on the skin  weight gain (slight) This list may not describe all possible side effects. Call your doctor for medical advice about side effects. You may report side effects to FDA at 1-800-FDA-1088. Where should I keep my medicine? Keep out of the reach of children. Store at room temperature between 15 and 30 degrees C (59 and 86 degrees F). Throw away any unused medicine after the expiration date. NOTE: This sheet is a summary. It may not cover all possible information. If you have questions about this medicine, talk to your doctor, pharmacist, or health care provider.  2020 Elsevier/Gold Standard (2016-08-24 08:04:41)  

## 2020-02-29 NOTE — Telephone Encounter (Signed)
mychart message regarding her choice of pharmacy to update chart

## 2020-03-22 ENCOUNTER — Encounter: Payer: 59 | Admitting: Certified Nurse Midwife

## 2020-04-05 ENCOUNTER — Encounter: Payer: 59 | Admitting: Certified Nurse Midwife

## 2020-07-04 ENCOUNTER — Ambulatory Visit (INDEPENDENT_AMBULATORY_CARE_PROVIDER_SITE_OTHER): Payer: 59 | Admitting: Certified Nurse Midwife

## 2020-07-04 ENCOUNTER — Other Ambulatory Visit (HOSPITAL_COMMUNITY)
Admission: RE | Admit: 2020-07-04 | Discharge: 2020-07-04 | Disposition: A | Discharge status: Home / Self Care | Payer: 59 | Source: Ambulatory Visit | Attending: Certified Nurse Midwife | Admitting: Certified Nurse Midwife

## 2020-07-04 ENCOUNTER — Encounter: Payer: Self-pay | Admitting: Certified Nurse Midwife

## 2020-07-04 ENCOUNTER — Other Ambulatory Visit: Payer: Self-pay

## 2020-07-04 VITALS — BP 131/84 | HR 55 | Ht 67.0 in | Wt 157.0 lb

## 2020-07-04 DIAGNOSIS — Z3041 Encounter for surveillance of contraceptive pills: Secondary | ICD-10-CM | POA: Diagnosis not present

## 2020-07-04 DIAGNOSIS — Z124 Encounter for screening for malignant neoplasm of cervix: Secondary | ICD-10-CM | POA: Diagnosis not present

## 2020-07-04 DIAGNOSIS — Z01419 Encounter for gynecological examination (general) (routine) without abnormal findings: Secondary | ICD-10-CM | POA: Insufficient documentation

## 2020-07-04 MED ORDER — LO LOESTRIN FE 1 MG-10 MCG / 10 MCG PO TABS: 1.0000 | ORAL_TABLET | Freq: Every day | ORAL | 4 refills | Status: DC

## 2020-07-04 NOTE — Patient Instructions (Signed)
Ethinyl Estradiol; Norethindrone Acetate; Ferrous fumarate tablets or capsules What is this medicine? ETHINYL ESTRADIOL; NORETHINDRONE ACETATE; FERROUS FUMARATE (ETH in il es tra DYE ole; nor eth IN drone AS e tate; FER us FUE ma rate) is an oral contraceptive. The products combine two types of female hormones, an estrogen and a progestin. They are used to prevent ovulation and pregnancy. Some products are also used to treat acne in females. This medicine may be used for other purposes; ask your health care provider or pharmacist if you have questions. COMMON BRAND NAME(S): Aurovela 24 Fe 1/20, Aurovela Fe, Blisovi 24 Fe, Blisovi Fe, Estrostep Fe, Gildess 24 Fe, Gildess Fe 1.5/30, Gildess Fe 1/20, Hailey 24 Fe, Hailey Fe 1.5/30, Junel Fe 1.5/30, Junel Fe 1/20, Junel Fe 24, Larin Fe, Lo Loestrin Fe, Loestrin 24 Fe, Loestrin FE 1.5/30, Loestrin FE 1/20, Lomedia 24 Fe, Microgestin 24 Fe, Microgestin Fe 1.5/30, Microgestin Fe 1/20, Tarina 24 Fe, Tarina Fe 1/20, Taytulla, Tilia Fe, Tri-Legest Fe What should I tell my health care provider before I take this medicine? They need to know if you have any of these conditions:  abnormal vaginal bleeding  blood vessel disease  breast, cervical, endometrial, ovarian, liver, or uterine cancer  diabetes  gallbladder disease  heart disease or recent heart attack  high blood pressure  high cholesterol  history of blood clots  kidney disease  liver disease  migraine headaches  smoke tobacco  stroke  systemic lupus erythematosus (SLE)  an unusual or allergic reaction to estrogens, progestins, other medicines, foods, dyes, or preservatives  pregnant or trying to get pregnant  breast-feeding How should I use this medicine? Take this medicine by mouth. To reduce nausea, this medicine may be taken with food. Follow the directions on the prescription label. Take this medicine at the same time each day and in the order directed on the package. Do  not take your medicine more often than directed. A patient package insert for the product will be given with each prescription and refill. Read this sheet carefully each time. The sheet may change frequently. Contact your pediatrician regarding the use of this medicine in children. Special care may be needed. This medicine has been used in female children who have started having menstrual periods. Overdosage: If you think you have taken too much of this medicine contact a poison control center or emergency room at once. NOTE: This medicine is only for you. Do not share this medicine with others. What if I miss a dose? If you miss a dose, refer to the patient information sheet you received with your medicine for direction. If you miss more than one pill, this medicine may not be as effective and you may need to use another form of birth control. What may interact with this medicine? Do not take this medicine with the following medication:  dasabuvir; ombitasvir; paritaprevir; ritonavir  ombitasvir; paritaprevir; ritonavir This medicine may also interact with the following medications:  acetaminophen  antibiotics or medicines for infections, especially rifampin, rifabutin, rifapentine, and griseofulvin, and possibly penicillins or tetracyclines  aprepitant  ascorbic acid (vitamin C)  atorvastatin  barbiturate medicines, such as phenobarbital  bosentan  carbamazepine  caffeine  clofibrate  cyclosporine  dantrolene  doxercalciferol  felbamate  grapefruit juice  hydrocortisone  medicines for anxiety or sleeping problems, such as diazepam or temazepam  medicines for diabetes, including pioglitazone  mineral oil  modafinil  mycophenolate  nefazodone  oxcarbazepine  phenytoin  prednisolone  ritonavir or other medicines for   HIV infection or AIDS  rosuvastatin  selegiline  soy isoflavones supplements  St. John's wort  tamoxifen or  raloxifene  theophylline  thyroid hormones  topiramate  warfarin This list may not describe all possible interactions. Give your health care provider a list of all the medicines, herbs, non-prescription drugs, or dietary supplements you use. Also tell them if you smoke, drink alcohol, or use illegal drugs. Some items may interact with your medicine. What should I watch for while using this medicine? Visit your doctor or health care professional for regular checks on your progress. You will need a regular breast and pelvic exam and Pap smear while on this medicine. Use an additional method of contraception during the first cycle that you take these tablets. If you have any reason to think you are pregnant, stop taking this medicine right away and contact your doctor or health care professional. If you are taking this medicine for hormone related problems, it may take several cycles of use to see improvement in your condition. Smoking increases the risk of getting a blood clot or having a stroke while you are taking birth control pills, especially if you are more than 32 years old. You are strongly advised not to smoke. This medicine can make your body retain fluid, making your fingers, hands, or ankles swell. Your blood pressure can go up. Contact your doctor or health care professional if you feel you are retaining fluid. This medicine can make you more sensitive to the sun. Keep out of the sun. If you cannot avoid being in the sun, wear protective clothing and use sunscreen. Do not use sun lamps or tanning beds/booths. If you wear contact lenses and notice visual changes, or if the lenses begin to feel uncomfortable, consult your eye care specialist. In some women, tenderness, swelling, or minor bleeding of the gums may occur. Notify your dentist if this happens. Brushing and flossing your teeth regularly may help limit this. See your dentist regularly and inform your dentist of the medicines you  are taking. If you are going to have elective surgery, you may need to stop taking this medicine before the surgery. Consult your health care professional for advice. This medicine does not protect you against HIV infection (AIDS) or any other sexually transmitted diseases. What side effects may I notice from receiving this medicine? Side effects that you should report to your doctor or health care professional as soon as possible:  allergic reactions like skin rash, itching or hives, swelling of the face, lips, or tongue  breast tissue changes or discharge  changes in vaginal bleeding during your period or between your periods  changes in vision  chest pain  confusion  coughing up blood  dizziness  feeling faint or lightheaded  headaches or migraines  leg, arm or groin pain  loss of balance or coordination  severe or sudden headaches  stomach pain (severe)  sudden shortness of breath  sudden numbness or weakness of the face, arm or leg  symptoms of vaginal infection like itching, irritation or unusual discharge  tenderness in the upper abdomen  trouble speaking or understanding  vomiting  yellowing of the eyes or skin Side effects that usually do not require medical attention (report to your doctor or health care professional if they continue or are bothersome):  breakthrough bleeding and spotting that continues beyond the 3 initial cycles of pills  breast tenderness  mood changes, anxiety, depression, frustration, anger, or emotional outbursts  increased sensitivity to sun   or ultraviolet light  nausea  skin rash, acne, or brown spots on the skin  weight gain (slight) This list may not describe all possible side effects. Call your doctor for medical advice about side effects. You may report side effects to FDA at 1-800-FDA-1088. Where should I keep my medicine? Keep out of the reach of children. Store at room temperature between 15 and 30 degrees C  (59 and 86 degrees F). Throw away any unused medicine after the expiration date. NOTE: This sheet is a summary. It may not cover all possible information. If you have questions about this medicine, talk to your doctor, pharmacist, or health care provider.  2020 Elsevier/Gold Standard (2016-08-24 08:04:41)   Preventive Care 21-39 Years Old, Female Preventive care refers to visits with your health care provider and lifestyle choices that can promote health and wellness. This includes:  A yearly physical exam. This may also be called an annual well check.  Regular dental visits and eye exams.  Immunizations.  Screening for certain conditions.  Healthy lifestyle choices, such as eating a healthy diet, getting regular exercise, not using drugs or products that contain nicotine and tobacco, and limiting alcohol use. What can I expect for my preventive care visit? Physical exam Your health care provider will check your:  Height and weight. This may be used to calculate body mass index (BMI), which tells if you are at a healthy weight.  Heart rate and blood pressure.  Skin for abnormal spots. Counseling Your health care provider may ask you questions about your:  Alcohol, tobacco, and drug use.  Emotional well-being.  Home and relationship well-being.  Sexual activity.  Eating habits.  Work and work environment.  Method of birth control.  Menstrual cycle.  Pregnancy history. What immunizations do I need?  Influenza (flu) vaccine  This is recommended every year. Tetanus, diphtheria, and pertussis (Tdap) vaccine  You may need a Td booster every 10 years. Varicella (chickenpox) vaccine  You may need this if you have not been vaccinated. Human papillomavirus (HPV) vaccine  If recommended by your health care provider, you may need three doses over 6 months. Measles, mumps, and rubella (MMR) vaccine  You may need at least one dose of MMR. You may also need a second  dose. Meningococcal conjugate (MenACWY) vaccine  One dose is recommended if you are age 19-21 years and a first-year college student living in a residence hall, or if you have one of several medical conditions. You may also need additional booster doses. Pneumococcal conjugate (PCV13) vaccine  You may need this if you have certain conditions and were not previously vaccinated. Pneumococcal polysaccharide (PPSV23) vaccine  You may need one or two doses if you smoke cigarettes or if you have certain conditions. Hepatitis A vaccine  You may need this if you have certain conditions or if you travel or work in places where you may be exposed to hepatitis A. Hepatitis B vaccine  You may need this if you have certain conditions or if you travel or work in places where you may be exposed to hepatitis B. Haemophilus influenzae type b (Hib) vaccine  You may need this if you have certain conditions. You may receive vaccines as individual doses or as more than one vaccine together in one shot (combination vaccines). Talk with your health care provider about the risks and benefits of combination vaccines. What tests do I need?  Blood tests  Lipid and cholesterol levels. These may be checked every 5 years   starting at age 20.  Hepatitis C test.  Hepatitis B test. Screening  Diabetes screening. This is done by checking your blood sugar (glucose) after you have not eaten for a while (fasting).  Sexually transmitted disease (STD) testing.  BRCA-related cancer screening. This may be done if you have a family history of breast, ovarian, tubal, or peritoneal cancers.  Pelvic exam and Pap test. This may be done every 3 years starting at age 21. Starting at age 30, this may be done every 5 years if you have a Pap test in combination with an HPV test. Talk with your health care provider about your test results, treatment options, and if necessary, the need for more tests. Follow these instructions at  home: Eating and drinking   Eat a diet that includes fresh fruits and vegetables, whole grains, lean protein, and low-fat dairy.  Take vitamin and mineral supplements as recommended by your health care provider.  Do not drink alcohol if: ? Your health care provider tells you not to drink. ? You are pregnant, may be pregnant, or are planning to become pregnant.  If you drink alcohol: ? Limit how much you have to 0-1 drink a day. ? Be aware of how much alcohol is in your drink. In the [place] one drink equals one 12 oz bottle of beer (355 mL), one 5 oz glass of wine (148 mL), or one 1 oz glass of hard liquor (44 mL). Lifestyle  Take daily care of your teeth and gums.  Stay active. Exercise for at least 30 minutes on 5 or more days each week.  Do not use any products that contain nicotine or tobacco, such as cigarettes, e-cigarettes, and chewing tobacco. If you need help quitting, ask your health care provider.  If you are sexually active, practice safe sex. Use a condom or other form of birth control (contraception) in order to prevent pregnancy and STIs (sexually transmitted infections). If you plan to become pregnant, see your health care provider for a preconception visit. What's next?  Visit your health care provider once a year for a well check visit.  Ask your health care provider how often you should have your eyes and teeth checked.  Stay up to date on all vaccines. This information is not intended to replace advice given to you by your health care provider. Make sure you discuss any questions you have with your health care provider. Document Revised: 08/25/2018 Document Reviewed: 08/25/2018 Elsevier Patient Education  2020 Elsevier Inc.  

## 2020-07-04 NOTE — Progress Notes (Signed)
ANNUAL PREVENTATIVE CARE GYN  ENCOUNTER NOTE  Subjective:       Barbara Finley is a 34 y.o. G0P0000 female here for a routine annual gynecologic exam.  Current complaints: 1. Desires Pap smear 2. Requests birth control refill  Denies difficulty breathing or respiratory distress, chest pain, abdominal pain, excessive vaginal bleeding, dysuria, and leg pain or swelling.    Gynecologic History  No LMP recorded (lmp unknown). (Menstrual status: Oral contraceptives).  Contraception: OCP (estrogen/progesterone), Lo loestrin  Last Pap: 2018. Results were: normal  Obstetric History  OB History  Gravida Para Term Preterm AB Living  0 0 0 0 0 0  SAB TAB Ectopic Multiple Live Births  0 0 0 0 0    Past Medical History:  Diagnosis Date  . Bacterial vaginosis   . Environmental allergies   . Yeast infection     Past Surgical History:  Procedure Laterality Date  . TONSILLECTOMY      No Known Allergies  Social History   Socioeconomic History  . Marital status: Single    Spouse name: Not on file  . Number of children: Not on file  . Years of education: Not on file  . Highest education level: Not on file  Occupational History  . Not on file  Tobacco Use  . Smoking status: Never Smoker  . Smokeless tobacco: Never Used  Vaping Use  . Vaping Use: Never used  Substance and Sexual Activity  . Alcohol use: No  . Drug use: No  . Sexual activity: Yes    Birth control/protection: I.U.D.    Comment: mirena  Other Topics Concern  . Not on file  Social History Narrative  . Not on file   Social Determinants of Health   Financial Resource Strain:   . Difficulty of Paying Living Expenses:   Food Insecurity:   . Worried About Programme researcher, broadcasting/film/video in the Last Year:   . Barista in the Last Year:   Transportation Needs:   . Freight forwarder (Medical):   Marland Kitchen Lack of Transportation (Non-Medical):   Physical Activity:   . Days of Exercise per Week:   . Minutes of  Exercise per Session:   Stress:   . Feeling of Stress :   Social Connections:   . Frequency of Communication with Friends and Family:   . Frequency of Social Gatherings with Friends and Family:   . Attends Religious Services:   . Active Member of Clubs or Organizations:   . Attends Banker Meetings:   Marland Kitchen Marital Status:   Intimate Partner Violence:   . Fear of Current or Ex-Partner:   . Emotionally Abused:   Marland Kitchen Physically Abused:   . Sexually Abused:     No family history on file.  The following portions of the patient's history were reviewed and updated as appropriate: allergies, current medications, past family history, past medical history, past social history, past surgical history and problem list.  Review of Systems  ROS negative except as noted above. Information obtained from patient.    Objective:   BP 131/84   Pulse (!) 55   Ht 5\' 7"  (1.702 m)   Wt 157 lb (71.2 kg)   LMP  (LMP Unknown)   BMI 24.59 kg/m   CONSTITUTIONAL: Well-developed, well-nourished female in no acute distress.   PSYCHIATRIC: Normal mood and affect. Normal  behavior. Normal judgment and thought content.  NEUROLGIC: Alert and oriented to person, place, and time. Normal  muscle tone coordination. No cranial nerve deficit noted.  HENT:  Normocephalic, atraumatic, External right and left ear normal.  EYES: Conjunctivae and EOM are normal. Pupils are equal and round.   NECK: Normal range of motion, supple, no masses.  Normal thyroid.   SKIN: Skin is warm and dry. No rash noted. Not diaphoretic. No erythema. No pallor.  CARDIOVASCULAR: Normal heart rate noted, regular rhythm, no murmur.  RESPIRATORY: Clear to auscultation bilaterally. Effort and breath sounds normal, no problems with respiration noted.  BREASTS: Symmetric in size. No masses, skin changes, nipple drainage, or lymphadenopathy.  ABDOMEN: Soft, normal bowel sounds, no distention noted.  No tenderness, rebound or  guarding.   PELVIC:  External Genitalia: Normal  Vagina: Normal  Cervix: Normal, Pap collected  Uterus: Normal  Adnexa: Normal  MUSCULOSKELETAL: Normal range of motion. No tenderness.  No cyanosis, clubbing, or edema.  2+ distal pulses.  LYMPHATIC: No Axillary, Supraclavicular, or Inguinal Adenopathy.  Assessment:   Annual gynecologic examination 34 y.o.   Contraception: OCP (estrogen/progesterone), Lo loestrin   Normal BMI   Problem List Items Addressed This Visit    None    Visit Diagnoses    Well woman exam    -  Primary   Relevant Orders   Cytology - PAP   Screening for cervical cancer       Relevant Orders   Cytology - PAP   Surveillance for birth control, oral contraceptives          Plan:   Pap: Pap Co Test  Labs: Declined  Routine preventative health maintenance measures emphasized: Exercise/Diet/Weight control, Tobacco Warnings, Alcohol/Substance use risks, Stress Management, Peer Pressure Issues and Safe Sex; see AVS  Rx Lo Loestrin, see orders  Reviewed red flag symptoms and when to call  Return to Clinic - 1 Year for [facility] or sooner if needed   Serafina Royals, CNM  [facility], [facility] 07/04/20 3:54 PM

## 2020-07-09 LAB — CYTOLOGY - PAP
Comment: NEGATIVE
Diagnosis: NEGATIVE
High risk HPV: NEGATIVE

## 2020-08-16 ENCOUNTER — Other Ambulatory Visit: Payer: Self-pay | Admitting: Certified Nurse Midwife

## 2020-08-16 DIAGNOSIS — Z3041 Encounter for surveillance of contraceptive pills: Secondary | ICD-10-CM

## 2020-08-16 MED ORDER — NORETHINDRONE ACET-ETHINYL EST 1-20 MG-MCG PO TABS: 1.0000 | ORAL_TABLET | Freq: Every day | ORAL | 11 refills | Status: DC

## 2020-08-16 NOTE — Progress Notes (Signed)
Rx Microgestin, see orders.    Barbara Finley, CNM [facility], [facility] 08/16/20 2:57 PM

## 2020-08-16 NOTE — Telephone Encounter (Signed)
Pt called in and stated that she needs a refill on her BC. The pt is requesting something else that is cheaper. The pt stated she is out and doesn't want to miss another day. The pt uses [facility] in [place]. Please advise

## 2020-08-16 NOTE — Telephone Encounter (Signed)
Place LoLoestrin sample up front for patient. Thanks, Serafina Royals, CNM

## 2021-01-07 ENCOUNTER — Other Ambulatory Visit: Payer: Self-pay

## 2021-01-07 MED ORDER — NORETHINDRONE ACET-ETHINYL EST 1-20 MG-MCG PO TABS: 1.0000 | ORAL_TABLET | Freq: Every day | ORAL | 1 refills | Status: DC

## 2021-03-11 ENCOUNTER — Other Ambulatory Visit: Payer: Self-pay

## 2021-03-11 MED ORDER — METRONIDAZOLE 500 MG PO TABS: 500.0000 mg | ORAL_TABLET | Freq: Two times a day (BID) | ORAL | 0 refills | Status: DC

## 2021-03-18 ENCOUNTER — Other Ambulatory Visit: Payer: Self-pay

## 2021-03-20 ENCOUNTER — Other Ambulatory Visit: Payer: Self-pay

## 2021-03-20 MED ORDER — NORETHINDRONE ACET-ETHINYL EST 1-20 MG-MCG PO TABS: 1.0000 | ORAL_TABLET | Freq: Every day | ORAL | 1 refills | Status: DC

## 2021-04-17 ENCOUNTER — Other Ambulatory Visit: Payer: Self-pay | Admitting: Obstetrics and Gynecology

## 2021-04-17 MED ORDER — METRONIDAZOLE 500 MG PO TABS
500.0000 mg | ORAL_TABLET | Freq: Two times a day (BID) | ORAL | 0 refills | Status: DC
Start: 2021-04-17 — End: 2021-08-26

## 2021-06-09 ENCOUNTER — Other Ambulatory Visit: Payer: Self-pay | Admitting: Certified Nurse Midwife

## 2021-06-30 ENCOUNTER — Other Ambulatory Visit: Payer: Self-pay | Admitting: Certified Nurse Midwife

## 2021-07-02 ENCOUNTER — Other Ambulatory Visit: Payer: Self-pay

## 2021-07-11 ENCOUNTER — Encounter: Payer: 59 | Admitting: Certified Nurse Midwife

## 2021-07-25 ENCOUNTER — Other Ambulatory Visit: Payer: Self-pay | Admitting: Certified Nurse Midwife

## 2021-07-28 ENCOUNTER — Other Ambulatory Visit: Payer: Self-pay | Admitting: Surgical

## 2021-07-28 MED ORDER — NORETHINDRONE ACET-ETHINYL EST 1-20 MG-MCG PO TABS: 1.0000 | ORAL_TABLET | Freq: Every day | ORAL | 0 refills | Status: DC

## 2021-08-18 ENCOUNTER — Other Ambulatory Visit: Payer: Self-pay | Admitting: Obstetrics and Gynecology

## 2021-08-26 ENCOUNTER — Ambulatory Visit (INDEPENDENT_AMBULATORY_CARE_PROVIDER_SITE_OTHER): Payer: 59 | Admitting: Obstetrics and Gynecology

## 2021-08-26 ENCOUNTER — Encounter: Payer: Self-pay | Admitting: Obstetrics and Gynecology

## 2021-08-26 ENCOUNTER — Other Ambulatory Visit: Payer: Self-pay

## 2021-08-26 VITALS — BP 125/83 | HR 73 | Ht 67.0 in | Wt 170.9 lb

## 2021-08-26 DIAGNOSIS — Z3009 Encounter for other general counseling and advice on contraception: Secondary | ICD-10-CM | POA: Diagnosis not present

## 2021-08-26 DIAGNOSIS — Z01419 Encounter for gynecological examination (general) (routine) without abnormal findings: Secondary | ICD-10-CM

## 2021-08-26 MED ORDER — NORETHINDRONE ACET-ETHINYL EST 1-20 MG-MCG PO TABS: 1.0000 | ORAL_TABLET | Freq: Every day | ORAL | 3 refills | Status: DC

## 2021-08-26 NOTE — Progress Notes (Signed)
HPI:      Barbara Finley is a 35 y.o. G1P0010 who LMP was No LMP recorded (lmp unknown). (Menstrual status: Oral contraceptives).  Subjective:   She presents today for her annual examination.  She is taking OCPs.  She is taking them correctly.  She states that she does not have menstrual periods while taking OCPs.  She has previously used the IUD but started having painful intercourse with the IUD and had it removed. She would like to stay on the Junel OCPs.  She states that she is still "on the fence" about ever having children.    Hx: The following portions of the patient's history were reviewed and updated as appropriate:             She  has a past medical history of Bacterial vaginosis, Environmental allergies, and Yeast infection. She does not have a problem list on file. She  has a past surgical history that includes Tonsillectomy. Her family history is not on file. She  reports that she has never smoked. She has never used smokeless tobacco. She reports that she does not drink alcohol and does not use drugs. She has a current medication list which includes the following prescription(s): norethindrone-ethinyl estradiol. She has No Known Allergies.       Review of Systems:  Review of Systems  Constitutional: Denied constitutional symptoms, night sweats, recent illness, fatigue, fever, insomnia and weight loss.  Eyes: Denied eye symptoms, eye pain, photophobia, vision change and visual disturbance.  Ears/Nose/Throat/Neck: Denied ear, nose, throat or neck symptoms, hearing loss, nasal discharge, sinus congestion and sore throat.  Cardiovascular: Denied cardiovascular symptoms, arrhythmia, chest pain/pressure, edema, exercise intolerance, orthopnea and palpitations.  Respiratory: Denied pulmonary symptoms, asthma, pleuritic pain, productive sputum, cough, dyspnea and wheezing.  Gastrointestinal: Denied, gastro-esophageal reflux, melena, nausea and vomiting.  Genitourinary: Denied  genitourinary symptoms including symptomatic vaginal discharge, pelvic relaxation issues, and urinary complaints.  Musculoskeletal: Denied musculoskeletal symptoms, stiffness, swelling, muscle weakness and myalgia.  Dermatologic: Denied dermatology symptoms, rash and scar.  Neurologic: Denied neurology symptoms, dizziness, headache, neck pain and syncope.  Psychiatric: Denied psychiatric symptoms, anxiety and depression.  Endocrine: Denied endocrine symptoms including hot flashes and night sweats.   Meds:   No current outpatient medications on file prior to visit.   No current facility-administered medications on file prior to visit.     Upstream - 08/26/21 1125       Pregnancy Intention Screening   Does the patient want to become pregnant in the next year? No    Does the patient's partner want to become pregnant in the next year? No    Would the patient like to discuss contraceptive options today? Yes      Contraception Wrap Up   Current Method Oral Contraceptive    End Method Oral Contraceptive    Contraception Counseling Provided Yes            The pregnancy intention screening data noted above was reviewed. Potential methods of contraception were discussed. The patient elected to proceed with Oral Contraceptive.    Objective:     Vitals:   08/26/21 1118  BP: 125/83  Pulse: 73    Filed Weights   08/26/21 1118  Weight: 170 lb 14.4 oz (77.5 kg)      Physical examination General NAD, Conversant  HEENT Atraumatic; Op clear with mmm.  Normo-cephalic. Pupils reactive. Anicteric sclerae  Thyroid/Neck Smooth without nodularity or enlargement. Normal ROM.  Neck Supple.  Skin No  rashes, lesions or ulceration. Normal palpated skin turgor. No nodularity.  Breasts: No masses or discharge.  Symmetric.  No axillary adenopathy.  Lungs: Clear to auscultation.No rales or wheezes. Normal Respiratory effort, no retractions.  Heart: NSR.  No murmurs or rubs appreciated. No  periferal edema  Abdomen: Soft.  Non-tender.  No masses.  No HSM. No hernia  Extremities: Moves all appropriately.  Normal ROM for age. No lymphadenopathy.  Neuro: Oriented to PPT.  Normal mood. Normal affect.     Pelvic:   Vulva: Normal appearance.  No lesions.  Vagina: No lesions or abnormalities noted.  Support: Normal pelvic support.  Urethra No masses tenderness or scarring.  Meatus Normal size without lesions or prolapse.  Cervix: Normal appearance.  No lesions.  Anus: Normal exam.  No lesions.  Perineum: Normal exam.  No lesions.        Bimanual   Uterus: Normal size.  Non-tender.  Mobile.  AV.  Adnexae: No masses.  Non-tender to palpation.  Cul-de-sac: Negative for abnormality.     Assessment:    G1P0010 There are no problems to display for this patient.    1. Encounter for well woman exam with routine gynecological exam   2. Birth control counseling        Plan:            1.  Basic Screening Recommendations The basic screening recommendations for asymptomatic women were discussed with the patient during her visit.  The age-appropriate recommendations were discussed with her and the rational for the tests reviewed.  When I am informed by the patient that another primary care physician has previously obtained the age-appropriate tests and they are up-to-date, only outstanding tests are ordered and referrals given as necessary.  Abnormal results of tests will be discussed with her when all of her results are completed.  Routine preventative health maintenance measures emphasized: Exercise/Diet/Weight control, Tobacco Warnings, Alcohol/Substance use risks and Stress Management 2.  Continue OCPs at patient request Orders No orders of the defined types were placed in this encounter.    Meds ordered this encounter  Medications   norethindrone-ethinyl estradiol (JUNEL 1/20) 1-20 MG-MCG tablet    Sig: Take 1 tablet by mouth daily.    Dispense:  90 tablet    Refill:  3     Patient needs to keep follow up appointment.          F/U  Return in about 1 year (around 08/26/2022) for Annual Physical.  Elonda Husky, M.D. 08/26/2021 11:38 AM

## 2021-08-26 NOTE — Progress Notes (Signed)
Pt present for annual exam. Pt stated that she was doing well. Pt stated that she would like to discuss other forms of birht control. Pt is currently taking LoLo.

## 2022-01-12 ENCOUNTER — Other Ambulatory Visit: Payer: Self-pay

## 2022-01-12 ENCOUNTER — Ambulatory Visit
Admission: EM | Admit: 2022-01-12 | Discharge: 2022-01-12 | Disposition: A | Discharge status: Home / Self Care | Payer: 59 | Attending: Emergency Medicine | Admitting: Emergency Medicine

## 2022-01-12 DIAGNOSIS — Z113 Encounter for screening for infections with a predominantly sexual mode of transmission: Secondary | ICD-10-CM | POA: Diagnosis present

## 2022-01-12 DIAGNOSIS — B3731 Acute candidiasis of vulva and vagina: Secondary | ICD-10-CM | POA: Insufficient documentation

## 2022-01-12 DIAGNOSIS — Z202 Contact with and (suspected) exposure to infections with a predominantly sexual mode of transmission: Secondary | ICD-10-CM

## 2022-01-12 LAB — HIV ANTIBODY (ROUTINE TESTING W REFLEX): HIV Screen 4th Generation wRfx: NONREACTIVE

## 2022-01-12 NOTE — ED Triage Notes (Signed)
Pt states that they had unprotected sex recently and wants a full check for STDs. Pt states that her partner had Herpes and that is the main concern.   Pt last had sex with their partner 2 nights ago. Pt has not had any outbreaks for genital herpes. Pt does not have a history of cold sores.

## 2022-01-12 NOTE — ED Provider Notes (Signed)
[facility]    CSN: [id number] Arrival date & time: 01/12/22  0800      History   Chief Complaint Chief Complaint  Patient presents with   Exposure to STD    HPI Barbara Finley is a 36 y.o. female.   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  has been having unprotected sex with partner for 2 years.  Denies all symptoms.  No history of cold sores or personal outbreaks.  No current lesions.     Past Medical History:  Diagnosis Date   Bacterial vaginosis    Environmental allergies    Yeast infection     There are no problems to display for this patient.   Past Surgical History:  Procedure Laterality Date   TONSILLECTOMY      OB History     Gravida  1   Para  0   Term  0   Preterm  0   AB  1   Living  0      SAB  0   IAB  1   Ectopic  0   Multiple  0   Live Births  0            Home Medications    Prior to Admission medications   Medication Sig Start Date End Date Taking? Authorizing Provider  norethindrone-ethinyl estradiol (JUNEL 1/20) 1-20 MG-MCG tablet Take 1 tablet by mouth daily. 08/26/21 08/26/22  Linzie Collin, MD    Family History History reviewed. No pertinent family history.  Social History Social History   Tobacco Use   Smoking status: Never   Smokeless tobacco: Never  Vaping Use   Vaping Use: Never used  Substance Use Topics   Alcohol use: No   Drug use: No     Allergies   Patient has no known allergies.   Review of Systems Review of Systems  Constitutional: Negative.   Respiratory: Negative.    Genitourinary: Negative.   Musculoskeletal: Negative.   Skin: Negative.   Neurological: Negative.     Physical Exam Triage Vital Signs ED Triage Vitals  Enc Vitals Group     BP 01/12/22 0822 120/82     Pulse Rate 01/12/22 0822 85     Resp 01/12/22 0822 18     Temp 01/12/22 0822 98.4 F (36.9 C)     Temp Source  01/12/22 0822 Oral     SpO2 01/12/22 0822 100 %     Weight 01/12/22 0821 170 lb (77.1 kg)     Height 01/12/22 0821 5\' 7"  (1.702 m)     Head Circumference --      Peak Flow --      Pain Score 01/12/22 0821 0     Pain Loc --      Pain Edu? --      Excl. in GC? --    No data found.  Updated Vital Signs BP 120/82 (BP Location: Left Arm)    Pulse 85    Temp 98.4 F (36.9 C) (Oral)    Resp 18    Ht 5\' 7"  (1.702 m)    Wt 170 lb (77.1 kg)    LMP  (LMP Unknown)    SpO2 100%    BMI 26.63 kg/m   Visual Acuity Right Eye Distance:   Left Eye Distance:   Bilateral Distance:    Right Eye Near:   Left Eye Near:  Bilateral Near:     Physical Exam Constitutional:      Appearance: Normal appearance.  HENT:     Head: Normocephalic.  Eyes:     Extraocular Movements: Extraocular movements intact.  Pulmonary:     Effort: Pulmonary effort is normal.  Genitourinary:    Comments: Deferred self collect vaginal swab  Skin:    General: Skin is warm and dry.  Neurological:     Mental Status: She is alert and oriented to person, place, and time. Mental status is at baseline.  Psychiatric:        Mood and Affect: Mood normal.        Behavior: Behavior normal.     UC Treatments / Results  Labs (all labs ordered are listed, but only abnormal results are displayed) Labs Reviewed - No data to display  EKG   Radiology No results found.  Procedures Procedures (including critical care time)  Medications Ordered in UC Medications - No data to display  Initial Impression / Assessment and Plan / UC Course  I have reviewed the triage vital signs and the nursing notes.  Pertinent labs & imaging results that were available during my care of the patient were reviewed by me and considered in my medical decision making (see chart for details).  Routine screening for STI Exposure to STD  STI screening including HSV 1 and 2 pending, will treat per protocol, advised abstinence until lab  results and/or treatment is complete, discussed herpes virus and given written handout, patient to monitor for lesions, advised condom use during all sexual encounters moving forward Final Clinical Impressions(s) / UC Diagnoses   Final diagnoses:  None   Discharge Instructions   None    ED Prescriptions   None    PDMP not reviewed this encounter.   Valinda Hoar, [place] 01/12/22 [phone]

## 2022-01-12 NOTE — Discharge Instructions (Signed)
If positive for HSV the virus typically lays dormant with intermittent outbreaks, if you begin to have painful clear fluid-filled blisters you may come to get treatment, inside your packet is more information about the HSV virus  Labs pending, you will be contacted if positive for any sti and treatment will be sent to the pharmacy, you will have to return to the clinic if positive for gonorrhea to receive treatment   Please refrain from having sex until labs results, if positive please refrain from having sex until treatment complete and symptoms resolve   If positive for HIV, Syphilis, Chlamydia  gonorrhea or trichomoniasis please notify partner or partners so they may tested as well  Moving forward, it is recommended you use some form of protection against the transmission of sti infections  such as condoms or dental dams with each sexual encounter

## 2022-01-13 ENCOUNTER — Telehealth: Payer: Self-pay

## 2022-01-13 LAB — HSV(HERPES SIMPLEX VRS) I + II AB-IGG
HSV 1 Glycoprotein G Ab, IgG: <0.91 index (ref 0.00–0.90)
HSV 2 Glycoprotein G Ab, IgG: 3.67 index — ABNORMAL HIGH (ref 0.00–0.90)

## 2022-01-13 LAB — CERVICOVAGINAL ANCILLARY ONLY
Bacterial Vaginitis (gardnerella): NEGATIVE
Candida Glabrata: NEGATIVE
Candida Vaginitis: POSITIVE — AB
Chlamydia: NEGATIVE
Comment: NEGATIVE
Comment: NEGATIVE
Comment: NEGATIVE
Comment: NEGATIVE
Comment: NEGATIVE
Comment: NORMAL
Neisseria Gonorrhea: NEGATIVE
Trichomonas: NEGATIVE

## 2022-01-13 LAB — RPR: RPR Ser Ql: NONREACTIVE

## 2022-01-13 LAB — HSV-2 IGG SUPPLEMENTAL TEST: HSV-2 IgG Supplemental Test: POSITIVE — AB

## 2022-01-13 MED ORDER — FLUCONAZOLE 150 MG PO TABS: 150.0000 mg | ORAL_TABLET | Freq: Every day | ORAL | 0 refills | Status: DC

## 2022-08-10 ENCOUNTER — Encounter: Payer: Self-pay | Admitting: Obstetrics and Gynecology

## 2022-08-23 ENCOUNTER — Other Ambulatory Visit: Payer: Self-pay | Admitting: Obstetrics and Gynecology

## 2022-08-23 DIAGNOSIS — Z01419 Encounter for gynecological examination (general) (routine) without abnormal findings: Secondary | ICD-10-CM

## 2022-09-03 ENCOUNTER — Other Ambulatory Visit: Payer: Self-pay | Admitting: Obstetrics and Gynecology

## 2022-09-03 DIAGNOSIS — Z01419 Encounter for gynecological examination (general) (routine) without abnormal findings: Secondary | ICD-10-CM

## 2022-09-04 MED ORDER — NORETHINDRONE ACET-ETHINYL EST 1-20 MG-MCG PO TABS: 1.0000 | ORAL_TABLET | Freq: Every day | ORAL | 0 refills | Status: DC

## 2022-09-04 NOTE — Telephone Encounter (Signed)
One refill sent until patient is seen for her annual

## 2022-09-29 ENCOUNTER — Encounter: Payer: Self-pay | Admitting: Obstetrics and Gynecology

## 2023-03-22 ENCOUNTER — Other Ambulatory Visit: Payer: Self-pay | Admitting: Obstetrics and Gynecology

## 2023-03-22 DIAGNOSIS — Z01419 Encounter for gynecological examination (general) (routine) without abnormal findings: Secondary | ICD-10-CM

## 2023-03-22 MED ORDER — NORETHINDRONE ACET-ETHINYL EST 1-20 MG-MCG PO TABS: 1.0000 | ORAL_TABLET | Freq: Every day | ORAL | 0 refills | Status: DC

## 2023-04-28 ENCOUNTER — Ambulatory Visit: Payer: 59 | Admitting: Obstetrics and Gynecology

## 2023-04-29 ENCOUNTER — Encounter: Payer: Self-pay | Admitting: Obstetrics and Gynecology

## 2023-04-29 ENCOUNTER — Other Ambulatory Visit (HOSPITAL_COMMUNITY)
Admission: RE | Admit: 2023-04-29 | Discharge: 2023-04-29 | Disposition: A | Discharge status: Home / Self Care | Payer: 59 | Source: Ambulatory Visit | Attending: Obstetrics and Gynecology | Admitting: Obstetrics and Gynecology

## 2023-04-29 ENCOUNTER — Ambulatory Visit (INDEPENDENT_AMBULATORY_CARE_PROVIDER_SITE_OTHER): Payer: 59 | Admitting: Obstetrics and Gynecology

## 2023-04-29 VITALS — BP 123/85 | HR 65 | Ht 67.0 in | Wt 182.2 lb

## 2023-04-29 DIAGNOSIS — Z01419 Encounter for gynecological examination (general) (routine) without abnormal findings: Secondary | ICD-10-CM

## 2023-04-29 DIAGNOSIS — Z124 Encounter for screening for malignant neoplasm of cervix: Secondary | ICD-10-CM | POA: Diagnosis present

## 2023-04-29 MED ORDER — NORETHINDRONE ACET-ETHINYL EST 1-20 MG-MCG PO TABS: 1.0000 | ORAL_TABLET | Freq: Every day | ORAL | 3 refills | Status: DC

## 2023-04-29 NOTE — Progress Notes (Signed)
HPI:      [name] is a 37 y.o. G1P0010 who LMP was Patient's last menstrual period was 04/20/2023 (approximate).  Subjective:   She presents today for her annual examination.  She has no complaints.  She has not been very good about taking her OCPs but would like to continue on them and says that she will do better in the future.  She has seen become pregnant. 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  She got this from her boyfriend who has had it for several years.  She reports that she has never had an outbreak.    Hx: The following portions of the patient's history were reviewed and updated as appropriate:             She  has a past medical history of Bacterial vaginosis, Environmental allergies, Herpes, and Yeast infection. She does not have a problem list on file. She  has a past surgical history that includes Tonsillectomy. Her family history is not on file. She  reports that she has never smoked. She has never used smokeless tobacco. She reports that she does not drink alcohol and does not use drugs. She has a current medication list which includes the following prescription(s): norethindrone-ethinyl estradiol. She has No Known Allergies.       Review of Systems:  Review of Systems  Constitutional: Denied constitutional symptoms, night sweats, recent illness, fatigue, fever, insomnia and weight loss.  Eyes: Denied eye symptoms, eye pain, photophobia, vision change and visual disturbance.  Ears/Nose/Throat/Neck: Denied ear, nose, throat or neck symptoms, hearing loss, nasal discharge, sinus congestion and sore throat.  Cardiovascular: Denied cardiovascular symptoms, arrhythmia, chest pain/pressure, edema, exercise intolerance, orthopnea and palpitations.  Respiratory: Denied pulmonary symptoms, asthma, pleuritic pain, productive sputum, cough, dyspnea and wheezing.  Gastrointestinal: Denied, gastro-esophageal reflux, melena, nausea and vomiting.  Genitourinary:  Denied genitourinary symptoms including symptomatic vaginal discharge, pelvic relaxation issues, and urinary complaints.  Musculoskeletal: Denied musculoskeletal symptoms, stiffness, swelling, muscle weakness and myalgia.  Dermatologic: Denied dermatology symptoms, rash and scar.  Neurologic: Denied neurology symptoms, dizziness, headache, neck pain and syncope.  Psychiatric: Denied psychiatric symptoms, anxiety and depression.  Endocrine: Denied endocrine symptoms including hot flashes and night sweats.   Meds:   No current outpatient medications on file prior to visit.   No current facility-administered medications on file prior to visit.     Objective:     Vitals:   04/29/23 1316  BP: 123/85  Pulse: 65    Filed Weights   04/29/23 1316  Weight: 182 lb 3.2 oz (82.6 kg)              Physical examination General NAD, Conversant  HEENT Atraumatic; Op clear with mmm.  Normo-cephalic. Pupils reactive. Anicteric sclerae  Thyroid/Neck Smooth without nodularity or enlargement. Normal ROM.  Neck Supple.  Skin No rashes, lesions or ulceration. Normal palpated skin turgor. No nodularity.  Breasts: No masses or discharge.  Symmetric.  No axillary adenopathy.  Lungs: Clear to auscultation.No rales or wheezes. Normal Respiratory effort, no retractions.  Heart: NSR.  No murmurs or rubs appreciated. No peripheral edema  Abdomen: Soft.  Non-tender.  No masses.  No HSM. No hernia  Extremities: Moves all appropriately.  Normal ROM for age. No lymphadenopathy.  Neuro: Oriented to PPT.  Normal mood. Normal affect.     Pelvic:   Vulva: Normal appearance.  No lesions.  Vagina: No lesions or abnormalities noted.  Support: Normal pelvic  support.  Urethra No masses tenderness or scarring.  Meatus Normal size without lesions or prolapse.  Cervix: Normal appearance.  No lesions.  Anus: Normal exam.  No lesions.  Perineum: Normal exam.  No lesions.        Bimanual   Uterus: Normal size.   Non-tender.  Mobile.  AV.  Adnexae: No masses.  Non-tender to palpation.  Cul-de-sac: Negative for abnormality.     Assessment:    G1P0010 There are no problems to display for this patient.    1. Encounter for well woman exam with routine gynecological exam   2. Cervical cancer screening     Normal exam   Plan:            1.  Basic Screening Recommendations The basic screening recommendations for asymptomatic women were discussed with the patient during her visit.  The age-appropriate recommendations were discussed with her and the rational for the tests reviewed.  When I am informed by the patient that another primary care physician has previously obtained the age-appropriate tests and they are up-to-date, only outstanding tests are ordered and referrals given as necessary.  Abnormal results of tests will be discussed with her when all of her results are completed.  Routine preventative health maintenance measures emphasized: Exercise/Diet/Weight control, Tobacco Warnings, Alcohol/Substance use risks and Stress Management Pap performed 2.  She would like to continue OCPs-plans to take them more effectively. Orders No orders of the defined types were placed in this encounter.    Meds ordered this encounter  Medications   norethindrone-ethinyl estradiol (JUNEL 1/20) 1-20 MG-MCG tablet    Sig: Take 1 tablet by mouth daily.    Dispense:  90 tablet    Refill:  3    Patient needs to keep follow up appointment.            F/U  Return in about 1 year (around 04/28/2024) for Annual Physical.  Elonda Husky, M.D. 04/29/2023 1:45 PM

## 2023-04-29 NOTE — Progress Notes (Signed)
Patients presents for annual exam today. She states doing well with current OCP, having light regular cycles. Would like to discuss the possibility in becoming pregnant within the next year.  Due for pap smear, ordered. She states no other questions or concerns at this time.

## 2023-05-06 LAB — CYTOLOGY - PAP
Adequacy: ABSENT
Comment: NEGATIVE
Diagnosis: UNDETERMINED — AB
High risk HPV: NEGATIVE

## 2023-09-15 ENCOUNTER — Ambulatory Visit (INDEPENDENT_AMBULATORY_CARE_PROVIDER_SITE_OTHER): Payer: 59

## 2023-09-15 VITALS — BP 112/74 | HR 80 | Ht 67.0 in | Wt 184.0 lb

## 2023-09-15 DIAGNOSIS — Z3201 Encounter for pregnancy test, result positive: Secondary | ICD-10-CM

## 2023-09-15 DIAGNOSIS — Z3200 Encounter for pregnancy test, result unknown: Secondary | ICD-10-CM

## 2023-09-15 LAB — POCT URINE PREGNANCY: Preg Test, Ur: POSITIVE — AB

## 2023-09-15 NOTE — Patient Instructions (Addendum)
Commonly Asked Questions During Pregnancy  Cats: A parasite can be excreted in cat feces.  To avoid exposure you need to have another person empty the little box.  If you must empty the litter box you will need to wear gloves.  Wash your hands after handling your cat.  This parasite can also be found in raw or undercooked meat so this should also be avoided.  Colds, Sore Throats, Flu: Please check your medication sheet to see what you can take for symptoms.  If your symptoms are unrelieved by these medications please call the office.  Dental Work: Most any dental work Agricultural consultant recommends is permitted.  X-rays should only be taken during the first trimester if absolutely necessary.  Your abdomen should be shielded with a lead apron during all x-rays.  Please notify your provider prior to receiving any x-rays.  Novocaine is fine; gas is not recommended.  If your dentist requires a note from [place] prior to dental work please call the office and we will provide one for you.  Exercise: Exercise is an important part of staying healthy during your pregnancy.  You may continue most exercises you were accustomed to prior to pregnancy.  Later in your pregnancy you will most likely notice you have difficulty with activities requiring balance like riding a bicycle.  It is important that you listen to your body and avoid activities that put you at a higher risk of falling.  Adequate rest and staying well hydrated are a must!  If you have questions about the safety of specific activities ask your provider.   GENERAL INFORMATION & ADVICE FOR PREGNANT WOMEN   Diet   It is important to eat a well-balanced diet full of fresh fruits, vegetables, whole grains, low-fat dairy and a variety of proteins while pregnant. You should also drink at least 64 oz (8 cups) of water daily. Please refer to the Weight Gain section for information on the appropriate amount of weight gain recommended during pregnancy.       Foods to  Avoid    These foods may contain certain bacteria and elements that may make you and your baby sick   Unpasteurized milk, juice, cheese      Shark, swordfish, tile fish, and king mackerel   Soft cheeses (Feta, Brie, Blue cheese, Mexican-style cheese)   Smoked seafood (unless cooked in a dish/casserole)   Raw or undercooked meat, poultry, fish, shellfish, and eggs   Raw sprouts or unwashed raw fruits or vegetables   Avoid or thoroughly heat cold cuts, hot dogs, and deli meats       Caffeine   Most experts agree that moderation and common sense are key to consuming caffeine during pregnancy.  "Moderate" caffeine consumption is approximately 200-300mg  a day, which is similar to 1-3 (6oz) cups of coffee.  A few studies have shown there may be an increased risk of miscarriage with high caffeine intake.  Drinking too much caffeine can also cause heart palpitations (feeling like your heart is flip-flopping in your chest).       Smoking    Tobacco: Not smoking is one of the best gifts you can give your unborn child.  Nonsmokers are more likely to deliver a healthy baby of normal weight.  Smoking cigarettes during pregnancy is directly associated with low birth weight, premature births, miscarriage, and other complications. Babies born to mothers who smoke have a higher incidence of lung infections such as bronchitis and pneumonia, ear infections, and sudden  infant death syndrome (SIDS/crib death).  While there are no safe levels of smoking, the fewer cigarettes smoked the better.  If you feel that you need help quitting, you can call 1-800-QUIT-NOW.    Marijuana: No amount of marijuana has been proven to be safe during pregnancy.  Smoking marijuana during pregnancy can have long-lasting effects on your baby such as learning disabilities.         Alcohol   Drinking alcohol during pregnancy can be very harmful to your developing baby and has been linked to birth defects, miscarriages, and lower  birth weights.  Babies born with fetal alcohol syndrome have varying degrees of mental retardation, behavior problems, growth retardation, and abnormalities in facial features.    NO AMOUNT of alcohol has been proven safe during pregnancy, so it is wise to take the following precautions:    Do not drink alcoholic beverages when you are pregnant or trying to become pregnant   If you have a drinking problem, please talk with [place], or seek professional help as early as possible ([email or web address])   Be aware of the alcoholic content of food and drugs (cough medicines and nighttime cold remedies may contain alcohol)      Medicines & Drugs   You should only take medicines prescribed or recommended by your provider.  This is particularly important during the first 12 weeks of pregnancy when your baby is in a crucial period of developing. Medicines have different effects; some major, some minor, so be careful even if you think you might be pregnant.  Inform your provider of all medications, herbal supplements, and drugs you are taking: prescription drugs, street drugs, and over-the-counter drugs.    Babies can be born addicted to drugs.  If your provider does not know that your baby is going through a withdrawal period, either during your pregnancy or after birth, the condition can be very serious or even fatal.                   Weight Gain    Gaining the recommended amount of weight at an appropriate rate is important to you and your baby. In general, the more overweight you are prior to pregnancy, the less weight you should gain.  Despite the fact that weight gain may be limited when   you are overweight, you should never try to lose weight during pregnancy. Some women experience a small amount of harmless weight loss with nausea and vomiting or illnesses during pregnancy. Your weight should return to normal and you should begin to gain weight when your appetite returns to normal, averaging about  -1lb per week.  Ask [place] at your New OB visit what your pre-pregnancy BMI is so you will know what your recommended weight gain should be.      Pre-pregnancy BMI   Total Weight Gain Recommended    Underweight (<18.5)   28-40lbs   Normal weight (18.5-24.9)   25-35lbs   Overweight (25-29.9)   15-25lbs   Obese (30 or >)   11-20lbs      Exercise   Exercise is very important for you and your baby.  The only limits are no contact sports or other activities that put  pregnant people at risk for injury.  Remember the importance of staying hydrated with exercise.     You're tired, you are gaining weight, and you may not feel your best! Although most of the time these symptoms are normal during pregnancy, exercise may help provide  some relief. Becoming active and exercising at least 30 minutes on most days of the week can benefit your health by reducing backaches, constipation, bloating, swelling, increasing your energy, improving your mood and posture, promoting muscle tone, strength, and endurance, helping you sleep better, and may make labor easier.       Working   Most pregnant people are able to continue working full time jobs until they have their baby. We understand that pregnancy can be a time of many aches and pains, however as long as you are having a normal healthy pregnancy, we recommend that you continue to work. If you develop certain complications that could potentially be worsened by you working, we may suggest you begin medical family leave earlier. We recommend not pushing, pulling, or lifting more than 25lbs.       Environmental Exposures/Infections   Paint: Household painting is likely of low-risk during pregnancy.  Basic precautions are: working in a well-ventilated area, wearing protective clothing to cover your skin, and avoiding eating or drinking while painting.  Lead paints and paint strippers should be avoided.    Household solvents/cleaners: These are  usually not a major risk since use is only occasional and air levels are low. Women with industrial exposure to solvents, however, may be at an increased risk.  They should request information regarding the solvent from their employer, work in well-ventilated areas, and wear protective gear such as masks, gloves, and long-sleeve clothing while working with these solvents.    Hair dye: There are very few studies of hair dye use during pregnancy.  We know that only a very small amount of any product applied to the scalp is actually absorbed into your system, and therefore hair treatment with dye or perms is unlikely to be of concern. We do recommend waiting until after 14 weeks to apply any of these.    Pesticides: Exposure to pesticides during pregnancy should be avoided.    Toxoplasmosis: If you have a cat, we advise that you have someone else change the litter box.  Toxoplasmosis, a parasite carried by some cats, can cause miscarriage, birth defects, and mental retardation in your baby.  Holding and petting the cat are considered safe.  Also ensure that you wash all fruits and vegetables and cook meats thoroughly.    Cytomegalovirus: You can avoid this infection by always washing hands with warm water and soap after contact with diapers or oral/nasal secretions, not kissing children under 68 years old on the mouth or cheek, not sharing foods, drinks or oral utensils with young children, cleaning toys, countertops, and other surfaces that come in contact with children's urine or saliva.    Fifth's Disease: This is common in young children and presents as a high fever and a rash on the cheeks.  If you are exposed to this, please let us know.    Chicken Pox/Varicella: If you have never had chicken pox/are not immune and are exposed during your pregnancy, this could be dangerous for you, but it is very rare to pass to the baby.  Please let us know if you if you are exposed to someone with chicken pox. We  will check your immunity to varicella at your new OB visit. If you are not immune, you should consider getting vaccinated after you have the baby.    Rubella/German Measles: This is a harmless childhood disease that could cause serious fetal malformations and even miscarriage if contracted during the first 3 months of pregnancy. Please  let us know if you are exposed to this during your pregnancy. We will check your immunity at your new OB visit. If you are not immune to [place], you will receive a Rubella vaccine in the hospital after you have the baby. You cannot get this vaccine during pregnancy.    Zika/Mosquito Illnesses: Pregnant people are advised to avoid travel to areas with ongoing mosquito transmission of Zika virus.  If you are unsure of these areas, please ask [place] or you can look online at DirectoryExpo.ch.  Travelers are advised to take precautions against mosquito bites including wearing long-sleeved shirts and pants, staying in places with air conditioning, sleeping under a mosquito net, and using a DEET-based repellents.  Topically applied DEET does not pose hazards to the developing baby and is recommended anytime you may be exposed to mosquito bites.   COVID-19: Pregnant people with COVID-19 are at an increased risk for severe illness or death from COVID-19 than people of the same age who are not pregnant. Severe illness means you may require hospitalization, intensive care, or a ventilator to help you breathe.  Pregnant people with COVID-19 may also be more likely to have a baby that is born premature.  Currently there is limited information about the safety of COVID-19 vaccines during pregnancy.  It is your personal choice if you want to receive the vaccine during pregnancy.  To protect yourself from the virus, avoid people who have the virus, or have been exposed to someone with the virus.  It is also recommended to wear a mask, stay at least 6 feet away  from people who don't live with you, wash your hands or use hand sanitizer frequently, and avoid crowds.       Bathing/Hot Tubs    Consider a rubber mat in the tub or shower to prevent slipping.  Tub baths may become more difficult near the end of pregnancy when your center of balance shifts. Keep the water temperature warm, because hot water may make you feel dizzy or light headed. Avoid hyperthermia (high body temperature).  There's some evidence that hyperthermia in early pregnancy may cause damage to the baby.  Avoid hot tubs.       Care of Teeth    Brush and floss your teeth at least once a day.  This disrupts plaque and bacteria that cause tooth decay and helps you maintain healthy gums.  Your gums may bleed more easily during pregnancy.  Oral health is an important part of your total health.  We recommend that you continue seeing your dentist during pregnancy for regular cleanings and any problems you may have. Untreated dental disease can increase the risk of certain pregnancy complications.  If your dentist requests a dental release form to treat you during pregnancy, please let us know and we will provide one for you.       Sleeping Positions    As your uterus grows during pregnancy, you will no longer be able to sleep on your belly or flat on your back.  The best position for sleeping during pregnancy is on your left or right side with your knees bent.  Lying on your side helps to take the pressure of your growing uterus off of the large vein that carries blood from your legs back to your heart.  It also is good for taking pressure off of your lower back.  Try using extra pillows (between your knees, under your belly, behind your back) for more comfort.  Sexual Relations    If you are having a normal low-risk pregnancy, there are few restrictions on sexual intimacy during pregnancy.  However, it may be normal for your feelings about sex to change throughout the pregnancy.  You  may go through temporary periods when your desire for sexual intercourse increases or decreases.  You may notice a small amount of spotting when wiping after sex.  This usually goes away on its own.  If it does not go away, or is heavier than just spotting, please let us know or go to [facility] to be evaluated.  It is also common for intercourse to cause a few contractions, especially if you have an orgasm.  These contractions may be uncomfortable for you, but they are generally not harmful to the pregnancy or the baby.  Intercourse may be restricted if you are having certain problems during your pregnancy- we will let you know if this is the case.  We do not recommend having sex if you are having vaginal bleeding, preterm labor issues, or if your water is broken.       Intimate Partner Violence    One in five (20%) women report intimate partner violence during pregnancy.  Violence during pregnancy can endanger the life of a pregnant woman and her unborn baby, and greatly increases your risk of pregnancy complications.  Please let us know or call the numbers below if you need help. [employer] [phone], Crisis line [phone], website: helpincorporated.org          Travel   It is generally considered safe to travel during pregnancy, but there are some precautions to take.  Due to the increased risk for blood clots in your legs and lungs during pregnancy, we recommend walking around every 1-2 hours, not crossing your legs, and pointing your toes up and down often to help with the blood flow.  Traveling is NOT RECOMMENDED if you are having preterm labor issues (persistent contractions, vaginal bleeding, etc.) or are past 36 weeks.        Seat Belts    The use of seat belts including a shoulder harness and lap belt is strongly encouraged during pregnancy.  When your abdomen is large, wear it under your abdomen.          During your pregnancy  you will need to choose a provider to care for your baby after they are born.  Below is a list of the local providers.  If you need a list of the [facility] providers, please let us know.   Exposure to Children with illness: Try to avoid obvious exposure; report any symptoms to [place] when noted,  If you have chicken pos, red measles or mumps, you should be immune to these diseases.   Please do not take any vaccines while pregnant unless you have checked with your OB provider.  Fetal Movement: After 28 weeks we recommend you do "kick counts" twice daily.  Lie or sit down in a calm quiet environment and count your baby movements "kicks".  You should feel your baby at least 10 times per hour.  If you have not felt 10 kicks within the first hour get up, walk around and have something sweet to eat or drink then repeat for an additional hour.  If count remains less than 10 per hour notify your provider.  Fumigating: Follow your pest control agent's advice as to how long to stay out of your home.  Ventilate  the area well before re-entering.  Hemorrhoids:   Most over-the-counter preparations can be used during pregnancy.  Check your medication to see what is safe to use.  It is important to use a stool softener or fiber in your diet and to drink lots of liquids.  If hemorrhoids seem to be getting worse please call the office.   Hot Tubs:  Hot tubs Jacuzzis and saunas are not recommended while pregnant.  These increase your internal body temperature and should be avoided.  Intercourse:  Sexual intercourse is safe during pregnancy as long as you are comfortable, unless otherwise advised by your provider.  Spotting may occur after intercourse; report any bright red bleeding that is heavier than spotting.  Labor:  If you know that you are in labor, please go to the hospital.  If you are unsure, please call the office and let us help you decide what to do.  Lifting, straining, etc:  If your job requires heavy  lifting or straining please check with your provider for any limitations.  Generally, you should not lift items heavier than that you can lift simply with your hands and arms (no back muscles)  Painting:  Paint fumes do not harm your pregnancy, but may make you ill and should be avoided if possible.  Latex or water based paints have less odor than oils.  Use adequate ventilation while painting.  Permanents & Hair Color:  Chemicals in hair dyes are not recommended as they cause increase hair dryness which can increase hair loss during pregnancy.  " Highlighting" and permanents are allowed.  Dye may be absorbed differently and permanents may not hold as well during pregnancy.  Sunbathing:  Use a sunscreen, as skin burns easily during pregnancy.  Drink plenty of fluids; avoid over heating.  Tanning Beds:  Because their possible side effects are still unknown, tanning beds are not recommended.  Ultrasound Scans:  Routine ultrasounds are performed at approximately 20 weeks.  You will be able to see your baby's general anatomy an if you would like to know the gender this can usually be determined as well.  If it is questionable when you conceived you may also receive an ultrasound early in your pregnancy for dating purposes.  Otherwise ultrasound exams are not routinely performed unless there is a medical necessity.  Although you can request a scan we ask that you pay for it when conducted because insurance does not cover " patient request" scans.  Work: If your pregnancy proceeds without complications you may work until your due date, unless your physician or employer advises otherwise.  Round Ligament Pain/Pelvic Discomfort:  Sharp, shooting pains not associated with bleeding are fairly common, usually occurring in the second trimester of pregnancy.  They tend to be worse when standing up or when you remain standing for long periods of time.  These are the result of pressure of certain pelvic ligaments  called "round ligaments".  Rest, Tylenol and heat seem to be the most effective relief.  As the womb and fetus grow, they rise out of the pelvis and the discomfort improves.  Please notify the office if your pain seems different than that described.  It may represent a more serious condition. Here is some information about constipation that may help if your still having any issue or concerns you can always  Mychart or call us at [phone].   Common Medications Safe in Pregnancy  Acne:      Constipation:  Benzoyl Peroxide  Colace  Clindamycin      Dulcolax Suppository  Topica Erythromycin     Fibercon  Salicylic Acid      Metamucil         Miralax AVOID:        Senakot   Accutane    Cough:  Retin-A       Cough Drops  Tetracycline      Phenergan w/ Codeine if Rx  Minocycline      Robitussin (Plain & DM)  Antibiotics:     Crabs/Lice:  Ceclor       RID  Cephalosporins    AVOID:  E-Mycins      Kwell  Keflex  Macrobid/Macrodantin   Diarrhea:  Penicillin      Kao-Pectate  Zithromax      Imodium AD         PUSH FLUIDS AVOID:       Cipro     Fever:  Tetracycline      Tylenol (Regular or Extra  Minocycline       Strength)  Levaquin      Extra Strength-Do not          Exceed 8 tabs/24 hrs Caffeine:        200mg /day (equiv. To 1 cup of coffee or  approx. 3 12 oz sodas)         Gas: Cold/Hayfever:       Gas-X  Benadryl      Mylicon  Claritin       Phazyme  **Claritin-D        Chlor-Trimeton    Headaches:  Dimetapp      ASA-Free Excedrin  Drixoral-Non-Drowsy     Cold Compress  Mucinex (Guaifenasin)     Tylenol (Regular or Extra  Sudafed/Sudafed-12 Hour     Strength)  **Sudafed PE Pseudoephedrine   Tylenol Cold & Sinus     Vicks Vapor Rub  Zyrtec  **AVOID if Problems With Blood Pressure         Heartburn: Avoid lying down for at least 1 hour after meals  Aciphex      Maalox     Rash:  Milk of Magnesia     Benadryl    Mylanta       1% Hydrocortisone  Cream  Pepcid  Pepcid Complete   Sleep Aids:  Prevacid      Ambien   Prilosec       Benadryl  Rolaids       Chamomile Tea  Tums (Limit 4/day)     Unisom         Tylenol PM         Warm milk-add vanilla or  Hemorrhoids:       Sugar for taste  Anusol/Anusol H.C.  (RX: Analapram 2.5%)  Sugar Substitutes:  Hydrocortisone OTC     Ok in moderation  Preparation H      Tucks        Vaseline lotion applied to tissue with wiping    Herpes:     Throat:  Acyclovir      Oragel  Famvir  Valtrex     Vaccines:         Flu Shot Leg Cramps:       *Gardasil  Benadryl      Hepatitis A         Hepatitis B Nasal Spray:       Pneumovax  Saline Nasal Spray     Polio Booster  Tetanus Nausea:       Tuberculosis test or PPD  Vitamin B6 25 mg TID   AVOID:    Dramamine      *Gardasil  Emetrol       Live Poliovirus  Ginger Root 250 mg QID    MMR (measles, mumps &  High Complex Carbs @ Bedtime    rebella)  Sea Bands-Accupressure    Varicella (Chickenpox)  Unisom 1/2 tab TID     *No known complications           If received before Pain:         Known pregnancy;   Darvocet       Resume series after  Lortab        Delivery  Percocet    Yeast:   Tramadol      Femstat  Tylenol 3      Gyne-lotrimin  Ultram       Monistat  Vicodin           MISC:         All Sunscreens           Hair Coloring/highlights          Insect Repellant's          (Including DEET)         Mystic Tans

## 2023-09-15 NOTE — Progress Notes (Signed)
NURSE VISIT NOTE  Subjective:    Patient ID: Barbara Finley, female    DOB: 1986-03-14, 37 y.o.   MRN: [id number]  HPI  Patient is a 37 y.o. [id number] female who presents for evaluation of amenorrhea. She believes she could be pregnant. Current symptoms also include: breast tenderness and positive home pregnancy test. Last period was abnormal lighter than usual and has irregular periods.    Objective:    BP 112/74   Pulse 80   Ht 5\' 7"  (1.702 m)   Wt 184 lb (83.5 kg)   LMP 04/20/2023 (Approximate)   BMI 28.82 kg/m   Lab Review  Results for orders placed or performed in visit on 09/15/23  POCT urine pregnancy  Result Value Ref Range   Preg Test, Ur Positive (A) Negative    Assessment:   1. Possible pregnancy, not confirmed     Plan:   Pregnancy Test: Positive  Estimated Date of Delivery: None noted. BP Cuff Measurement taken. Cuff Size Adult Encouraged well-balanced diet, plenty of rest when needed, pre-natal vitamins daily and walking for exercise.  Discussed self-help for nausea, avoiding OTC medications until consulting provider or pharmacist, other than Tylenol as needed, minimal caffeine (1-2 cups daily) and avoiding alcohol.   She will schedule her nurse visit @ 7-[redacted] wks pregnant, u/s for dating @10  wk, and NOB visit at [redacted] wk pregnant.    Feel free to call with any questions.     Loney Laurence, CMA

## 2023-09-23 ENCOUNTER — Ambulatory Visit: Payer: 59

## 2023-09-23 VITALS — Wt 184.0 lb

## 2023-09-23 DIAGNOSIS — Z3480 Encounter for supervision of other normal pregnancy, unspecified trimester: Secondary | ICD-10-CM | POA: Insufficient documentation

## 2023-09-23 DIAGNOSIS — Z3689 Encounter for other specified antenatal screening: Secondary | ICD-10-CM

## 2023-09-23 DIAGNOSIS — Z369 Encounter for antenatal screening, unspecified: Secondary | ICD-10-CM

## 2023-09-23 NOTE — Patient Instructions (Signed)
First Trimester of Pregnancy  The first trimester of pregnancy starts on the first day of your last menstrual period until the end of week 12. This is also called months 1 through 3 of pregnancy. Body changes during your first trimester Your body goes through many changes during pregnancy. The changes usually return to normal after your baby is born. Physical changes You may gain or lose weight. Your breasts may grow larger and hurt. The area around your nipples may get darker. Dark spots or blotches may develop on your face. You may have changes in your hair. Health changes You may feel like you might vomit (nauseous), and you may vomit. You may have heartburn. You may have headaches. You may have trouble pooping (constipation). Your gums may bleed. Other changes You may get tired easily. You may pee (urinate) more often. Your menstrual periods will stop. You may not feel hungry. You may want to eat certain kinds of food. You may have changes in your emotions from day to day. You may have more dreams. Follow these instructions at home: Medicines Take over-the-counter and prescription medicines only as told by your doctor. Some medicines are not safe during pregnancy. Take a prenatal vitamin that contains at least 600 micrograms (mcg) of folic acid. Eating and drinking Eat healthy meals that include: Fresh fruits and vegetables. Whole grains. Good sources of protein, such as meat, eggs, or tofu. Low-fat dairy products. Avoid raw meat and unpasteurized juice, milk, and cheese. If you feel like you may vomit, or you vomit: Eat 4 or 5 small meals a day instead of 3 large meals. Try eating a few soda crackers. Drink liquids between meals instead of during meals. You may need to take these actions to prevent or treat trouble pooping: Drink enough fluids to keep your pee (urine) pale yellow. Eat foods that are high in fiber. These include beans, whole grains, and fresh fruits and  vegetables. Limit foods that are high in fat and sugar. These include fried or sweet foods. Activity Exercise only as told by your doctor. Most people can do their usual exercise routine during pregnancy. Stop exercising if you have cramps or pain in your lower belly (abdomen) or low back. Do not exercise if it is too hot or too humid, or if you are in a place of great height (high altitude). Avoid heavy lifting. If you choose to, you may have sex unless your doctor tells you not to. Relieving pain and discomfort Wear a good support bra if your breasts are sore. Rest with your legs raised (elevated) if you have leg cramps or low back pain. If you have bulging veins (varicose veins) in your legs: Wear support hose as told by your doctor. Raise your feet for 15 minutes, 3-4 times a day. Limit salt in your food. Safety Wear your seat belt at all times when you are in a car. Talk with your doctor if someone is hurting you or yelling at you. Talk with your doctor if you are feeling sad or have thoughts of hurting yourself. Lifestyle Do not use hot tubs, steam rooms, or saunas. Do not douche. Do not use tampons or scented sanitary pads. Do not use herbal medicines, illegal drugs, or medicines that are not approved by your doctor. Do not drink alcohol. Do not smoke or use any products that contain nicotine or tobacco. If you need help quitting, ask your doctor. Avoid cat litter boxes and soil that is used by cats. These carry   germs that can cause harm to the baby and can cause a loss of your baby by miscarriage or stillbirth. General instructions Keep all follow-up visits. This is important. Ask for help if you need counseling or if you need help with nutrition. Your doctor can give you advice or tell you where to go for help. Visit your dentist. At home, brush your teeth with a soft toothbrush. Floss gently. Write down your questions. Take them to your prenatal visits. Where to find more  information American Pregnancy Association: americanpregnancy.org American College of Obstetricians and Gynecologists: www.acog.org Office on Women's Health: womenshealth.gov/pregnancy Contact a doctor if: You are dizzy. You have a fever. You have mild cramps or pressure in your lower belly. You have a nagging pain in your belly area. You continue to feel like you may vomit, you vomit, or you have watery poop (diarrhea) for 24 hours or longer. You have a bad-smelling fluid coming from your vagina. You have pain when you pee. You are exposed to a disease that spreads from person to person, such as chickenpox, measles, Zika virus, HIV, or hepatitis. Get help right away if: You have spotting or bleeding from your vagina. You have very bad belly cramping or pain. You have shortness of breath or chest pain. You have any kind of injury, such as from a fall or a car crash. You have new or increased pain, swelling, or redness in an arm or leg. Summary The first trimester of pregnancy starts on the first day of your last menstrual period until the end of week 12 (months 1 through 3). Eat 4 or 5 small meals a day instead of 3 large meals. Do not smoke or use any products that contain nicotine or tobacco. If you need help quitting, ask your doctor. Keep all follow-up visits. This information is not intended to replace advice given to you by your health care provider. Make sure you discuss any questions you have with your health care provider. Document Revised: 05/22/2020 Document Reviewed: 03/28/2020 Elsevier Patient Education  2024 [employer] Commonly Asked Questions During Pregnancy  Cats: A parasite can be excreted in cat feces.  To avoid exposure you need to have another person empty the little box.  If you must empty the litter box you will need to wear gloves.  Wash your hands after handling your cat.  This parasite can also be found in raw or undercooked meat so this should also be  avoided.  Colds, Sore Throats, Flu: Please check your medication sheet to see what you can take for symptoms.  If your symptoms are unrelieved by these medications please call the office.  Dental Work: Most any dental work your dentist recommends is permitted.  X-rays should only be taken during the first trimester if absolutely necessary.  Your abdomen should be shielded with a lead apron during all x-rays.  Please notify your provider prior to receiving any x-rays.  Novocaine is fine; gas is not recommended.  If your dentist requires a note from us prior to dental work please call the office and we will provide one for you.  Exercise: Exercise is an important part of staying healthy during your pregnancy.  You may continue most exercises you were accustomed to prior to pregnancy.  Later in your pregnancy you will most likely notice you have difficulty with activities requiring balance like riding a bicycle.  It is important that you listen to your body and avoid activities that put you at a higher   risk of falling.  Adequate rest and staying well hydrated are a must!  If you have questions about the safety of specific activities ask your provider.    Exposure to Children with illness: Try to avoid obvious exposure; report any symptoms to us when noted,  If you have chicken pos, red measles or mumps, you should be immune to these diseases.   Please do not take any vaccines while pregnant unless you have checked with your OB provider.  Fetal Movement: After 28 weeks we recommend you do "kick counts" twice daily.  Lie or sit down in a calm quiet environment and count your baby movements "kicks".  You should feel your baby at least 10 times per hour.  If you have not felt 10 kicks within the first hour get up, walk around and have something sweet to eat or drink then repeat for an additional hour.  If count remains less than 10 per hour notify your provider.  Fumigating: Follow your pest control agent's  advice as to how long to stay out of your home.  Ventilate the area well before re-entering.  Hemorrhoids:   Most over-the-counter preparations can be used during pregnancy.  Check your medication to see what is safe to use.  It is important to use a stool softener or fiber in your diet and to drink lots of liquids.  If hemorrhoids seem to be getting worse please call the office.   Hot Tubs:  Hot tubs Jacuzzis and saunas are not recommended while pregnant.  These increase your internal body temperature and should be avoided.  Intercourse:  Sexual intercourse is safe during pregnancy as long as you are comfortable, unless otherwise advised by your provider.  Spotting may occur after intercourse; report any bright red bleeding that is heavier than spotting.  Labor:  If you know that you are in labor, please go to the hospital.  If you are unsure, please call the office and let us help you decide what to do.  Lifting, straining, etc:  If your job requires heavy lifting or straining please check with your provider for any limitations.  Generally, you should not lift items heavier than that you can lift simply with your hands and arms (no back muscles)  Painting:  Paint fumes do not harm your pregnancy, but may make you ill and should be avoided if possible.  Latex or water based paints have less odor than oils.  Use adequate ventilation while painting.  Permanents & Hair Color:  Chemicals in hair dyes are not recommended as they cause increase hair dryness which can increase hair loss during pregnancy.  " Highlighting" and permanents are allowed.  Dye may be absorbed differently and permanents may not hold as well during pregnancy.  Sunbathing:  Use a sunscreen, as skin burns easily during pregnancy.  Drink plenty of fluids; avoid over heating.  Tanning Beds:  Because their possible side effects are still unknown, tanning beds are not recommended.  Ultrasound Scans:  Routine ultrasounds are performed  at approximately 20 weeks.  You will be able to see your baby's general anatomy an if you would like to know the gender this can usually be determined as well.  If it is questionable when you conceived you may also receive an ultrasound early in your pregnancy for dating purposes.  Otherwise ultrasound exams are not routinely performed unless there is a medical necessity.  Although you can request a scan we ask that you pay for it when   conducted because insurance does not cover " patient request" scans.  Work: If your pregnancy proceeds without complications you may work until your due date, unless your physician or employer advises otherwise.  Round Ligament Pain/Pelvic Discomfort:  Sharp, shooting pains not associated with bleeding are fairly common, usually occurring in the second trimester of pregnancy.  They tend to be worse when standing up or when you remain standing for long periods of time.  These are the result of pressure of certain pelvic ligaments called "round ligaments".  Rest, Tylenol and heat seem to be the most effective relief.  As the womb and fetus grow, they rise out of the pelvis and the discomfort improves.  Please notify the office if your pain seems different than that described.  It may represent a more serious condition.  Common Medications Safe in Pregnancy  Acne:      Constipation:  Benzoyl Peroxide     Colace  Clindamycin      Dulcolax Suppository  Topica Erythromycin     Fibercon  Salicylic Acid      Metamucil         Miralax AVOID:        Senakot   Accutane    Cough:  Retin-A       Cough Drops  Tetracycline      Phenergan w/ Codeine if Rx  Minocycline      Robitussin (Plain & DM)  Antibiotics:     Crabs/Lice:  Ceclor       RID  Cephalosporins    AVOID:  E-Mycins      Kwell  Keflex  Macrobid/Macrodantin   Diarrhea:  Penicillin      Kao-Pectate  Zithromax      Imodium AD         PUSH FLUIDS AVOID:       Cipro     Fever:  Tetracycline      Tylenol (Regular  or Extra  Minocycline       Strength)  Levaquin      Extra Strength-Do not          Exceed 8 tabs/24 hrs Caffeine:        <200mg/day (equiv. To 1 cup of coffee or  approx. 3 12 oz sodas)         Gas: Cold/Hayfever:       Gas-X  Benadryl      Mylicon  Claritin       Phazyme  **Claritin-D        Chlor-Trimeton    Headaches:  Dimetapp      ASA-Free Excedrin  Drixoral-Non-Drowsy     Cold Compress  Mucinex (Guaifenasin)     Tylenol (Regular or Extra  Sudafed/Sudafed-12 Hour     Strength)  **Sudafed PE Pseudoephedrine   Tylenol Cold & Sinus     Vicks Vapor Rub  Zyrtec  **AVOID if Problems With Blood Pressure         Heartburn: Avoid lying down for at least 1 hour after meals  Aciphex      Maalox     Rash:  Milk of Magnesia     Benadryl    Mylanta       1% Hydrocortisone Cream  Pepcid  Pepcid Complete   Sleep Aids:  Prevacid      Ambien   Prilosec       Benadryl  Rolaids       Chamomile Tea  Tums (Limit 4/day)     Unisom           Tylenol PM         Warm milk-add vanilla or  Hemorrhoids:       Sugar for taste  Anusol/Anusol H.C.  (RX: Analapram 2.5%)  Sugar Substitutes:  Hydrocortisone OTC     Ok in moderation  Preparation H      Tucks        Vaseline lotion applied to tissue with wiping    Herpes:     Throat:  Acyclovir      Oragel  Famvir  Valtrex     Vaccines:         Flu Shot Leg Cramps:       *Gardasil  Benadryl      Hepatitis A         Hepatitis B Nasal Spray:       Pneumovax  Saline Nasal Spray     Polio Booster         Tetanus Nausea:       Tuberculosis test or PPD  Vitamin B6 25 mg TID   AVOID:    Dramamine      *Gardasil  Emetrol       Live Poliovirus  Ginger Root 250 mg QID    MMR (measles, mumps &  High Complex Carbs @ Bedtime    rebella)  Sea Bands-Accupressure    Varicella (Chickenpox)  Unisom 1/2 tab TID     *No known complications           If received before Pain:         Known pregnancy;   Darvocet       Resume series  after  Lortab        Delivery  Percocet    Yeast:   Tramadol      Femstat  Tylenol 3      Gyne-lotrimin  Ultram       Monistat  Vicodin           MISC:         All Sunscreens           Hair Coloring/highlights          Insect Repellant's          (Including DEET)         Mystic Tans  

## 2023-09-23 NOTE — Progress Notes (Signed)
New OB Intake  I connected with  Barbara Finley on 09/23/23 at 10:15 AM EDT by telephone and verified that I am speaking with the correct person using two identifiers. Nurse is located at [facility] and pt is located at [facility].  I explained I am completing New OB Intake today. We discussed her EDD of 05/06/2024 that is based on LMP of 07/31/2023. Pt is G2/P0010. I reviewed her allergies, medications, Medical/Surgical/OB history, and appropriate screenings. There are no cats in the home.  Based on history, this is a/an pregnancy uncomplicated .   Patient Active Problem List   Diagnosis Date Noted   Supervision of other normal pregnancy, antepartum 09/23/2023    Concerns addressed today None  Delivery Plans:  Plans to deliver at [facility].  Pt states she lives closer to [facility]; adv if she goes anywhere else she will have someone deliver her that she hasn't met before; we only deliver at [facility].  Anatomy US Explained first scheduled [place] will be Oct 2nd and an anatomy scan will be done at 20 weeks.  Labs Discussed genetic screening with patient. Patient desires genetic testing to be drawn at new OB visit. Discussed possible labs to be drawn at new OB appointment.  COVID Vaccine Patient has had COVID vaccine.   Social Determinants of Health Food Insecurity: denies food insecurity Transportation: Patient denies transportation needs. Childcare: Discussed no children allowed at ultrasound appointments.   First visit review I reviewed new OB appt with pt. I explained she will have ob bloodwork and pap smear/pelvic exam if indicated. Explained pt will be seen by an AOB Provider at first visit; encounter routed to appropriate provider.   Loran Senters, [facility] 09/23/2023  10:42 AM

## 2023-09-24 ENCOUNTER — Encounter: Payer: Self-pay | Admitting: Obstetrics and Gynecology

## 2023-09-29 ENCOUNTER — Ambulatory Visit: Payer: 59

## 2023-09-29 ENCOUNTER — Other Ambulatory Visit: Payer: Self-pay | Admitting: Obstetrics and Gynecology

## 2023-09-29 DIAGNOSIS — Z3687 Encounter for antenatal screening for uncertain dates: Secondary | ICD-10-CM | POA: Diagnosis not present

## 2023-09-29 DIAGNOSIS — Z3480 Encounter for supervision of other normal pregnancy, unspecified trimester: Secondary | ICD-10-CM

## 2023-09-29 DIAGNOSIS — Z369 Encounter for antenatal screening, unspecified: Secondary | ICD-10-CM

## 2023-09-29 DIAGNOSIS — Z3A08 8 weeks gestation of pregnancy: Secondary | ICD-10-CM

## 2023-10-27 ENCOUNTER — Encounter: Payer: 59 | Admitting: Obstetrics

## 2023-10-28 LAB — PANORAMA PRENATAL TEST FULL PANEL:PANORAMA TEST PLUS 5 ADDITIONAL MICRODELETIONS: FETAL FRACTION: 3.9
